# Patient Record
Sex: Female | Born: 1935 | Race: White | Hispanic: No | Marital: Married | Smoking: Never smoker
Health system: Southern US, Community
[De-identification: ages and names within clinical notes are randomized; demographics above are authoritative.]

## PROBLEM LIST (undated history)

## (undated) DIAGNOSIS — F329 Major depressive disorder, single episode, unspecified: Secondary | ICD-10-CM

## (undated) DIAGNOSIS — G20 Parkinson's disease: Secondary | ICD-10-CM

## (undated) DIAGNOSIS — G629 Polyneuropathy, unspecified: Secondary | ICD-10-CM

## (undated) DIAGNOSIS — F0280 Dementia in other diseases classified elsewhere without behavioral disturbance: Secondary | ICD-10-CM

## (undated) DIAGNOSIS — I10 Essential (primary) hypertension: Secondary | ICD-10-CM

## (undated) DIAGNOSIS — F32A Depression, unspecified: Secondary | ICD-10-CM

## (undated) DIAGNOSIS — E785 Hyperlipidemia, unspecified: Secondary | ICD-10-CM

## (undated) HISTORY — PX: OTHER SURGICAL HISTORY: SHX169

---

## 2018-04-06 ENCOUNTER — Telehealth: Payer: Self-pay | Admitting: Internal Medicine

## 2018-04-06 NOTE — Telephone Encounter (Signed)
Dr. Leone Payor Doc of the Day for 04/06/18 PM.   Patient lives at [facility] and is being referred from [employer] for diarrhea.  Referring Provider: Dr. Olene Craven [phone]  Records placed on Dr. Marvell Fuller desk for review.

## 2018-04-10 ENCOUNTER — Other Ambulatory Visit: Payer: Self-pay

## 2018-04-10 ENCOUNTER — Emergency Department (HOSPITAL_COMMUNITY): Payer: Medicare Other

## 2018-04-10 ENCOUNTER — Encounter (HOSPITAL_COMMUNITY): Payer: Self-pay | Admitting: Student

## 2018-04-10 ENCOUNTER — Inpatient Hospital Stay (HOSPITAL_COMMUNITY)
Admission: EM | Admit: 2018-04-10 | Discharge: 2018-04-24 | DRG: 193 | Disposition: A | Discharge status: Skilled Nursing Facility | Payer: Medicare Other | Source: Skilled Nursing Facility | Attending: Family Medicine | Admitting: Family Medicine

## 2018-04-10 ENCOUNTER — Inpatient Hospital Stay (HOSPITAL_COMMUNITY): Payer: Medicare Other

## 2018-04-10 DIAGNOSIS — Z1389 Encounter for screening for other disorder: Secondary | ICD-10-CM

## 2018-04-10 DIAGNOSIS — M4850XA Collapsed vertebra, not elsewhere classified, site unspecified, initial encounter for fracture: Secondary | ICD-10-CM | POA: Diagnosis not present

## 2018-04-10 DIAGNOSIS — G4700 Insomnia, unspecified: Secondary | ICD-10-CM | POA: Diagnosis present

## 2018-04-10 DIAGNOSIS — J181 Lobar pneumonia, unspecified organism: Secondary | ICD-10-CM | POA: Diagnosis present

## 2018-04-10 DIAGNOSIS — R339 Retention of urine, unspecified: Secondary | ICD-10-CM | POA: Diagnosis not present

## 2018-04-10 DIAGNOSIS — Z993 Dependence on wheelchair: Secondary | ICD-10-CM | POA: Diagnosis not present

## 2018-04-10 DIAGNOSIS — F329 Major depressive disorder, single episode, unspecified: Secondary | ICD-10-CM | POA: Diagnosis present

## 2018-04-10 DIAGNOSIS — J189 Pneumonia, unspecified organism: Secondary | ICD-10-CM

## 2018-04-10 DIAGNOSIS — R29898 Other symptoms and signs involving the musculoskeletal system: Secondary | ICD-10-CM | POA: Diagnosis not present

## 2018-04-10 DIAGNOSIS — R410 Disorientation, unspecified: Secondary | ICD-10-CM

## 2018-04-10 DIAGNOSIS — Z781 Physical restraint status: Secondary | ICD-10-CM | POA: Diagnosis not present

## 2018-04-10 DIAGNOSIS — Z79890 Hormone replacement therapy (postmenopausal): Secondary | ICD-10-CM

## 2018-04-10 DIAGNOSIS — R011 Cardiac murmur, unspecified: Secondary | ICD-10-CM | POA: Diagnosis present

## 2018-04-10 DIAGNOSIS — X58XXXA Exposure to other specified factors, initial encounter: Secondary | ICD-10-CM | POA: Diagnosis present

## 2018-04-10 DIAGNOSIS — Z79899 Other long term (current) drug therapy: Secondary | ICD-10-CM | POA: Diagnosis not present

## 2018-04-10 DIAGNOSIS — G3183 Dementia with Lewy bodies: Secondary | ICD-10-CM | POA: Diagnosis present

## 2018-04-10 DIAGNOSIS — Z9889 Other specified postprocedural states: Secondary | ICD-10-CM

## 2018-04-10 DIAGNOSIS — F0280 Dementia in other diseases classified elsewhere without behavioral disturbance: Secondary | ICD-10-CM | POA: Diagnosis present

## 2018-04-10 DIAGNOSIS — R404 Transient alteration of awareness: Secondary | ICD-10-CM | POA: Diagnosis not present

## 2018-04-10 DIAGNOSIS — R151 Fecal smearing: Secondary | ICD-10-CM

## 2018-04-10 DIAGNOSIS — R1310 Dysphagia, unspecified: Secondary | ICD-10-CM | POA: Diagnosis present

## 2018-04-10 DIAGNOSIS — R197 Diarrhea, unspecified: Secondary | ICD-10-CM | POA: Diagnosis present

## 2018-04-10 DIAGNOSIS — M810 Age-related osteoporosis without current pathological fracture: Secondary | ICD-10-CM | POA: Diagnosis present

## 2018-04-10 DIAGNOSIS — E785 Hyperlipidemia, unspecified: Secondary | ICD-10-CM | POA: Diagnosis present

## 2018-04-10 DIAGNOSIS — I10 Essential (primary) hypertension: Secondary | ICD-10-CM | POA: Diagnosis present

## 2018-04-10 DIAGNOSIS — R9431 Abnormal electrocardiogram [ECG] [EKG]: Secondary | ICD-10-CM | POA: Diagnosis present

## 2018-04-10 DIAGNOSIS — Z515 Encounter for palliative care: Secondary | ICD-10-CM

## 2018-04-10 DIAGNOSIS — G20 Parkinson's disease: Secondary | ICD-10-CM

## 2018-04-10 DIAGNOSIS — Z8673 Personal history of transient ischemic attack (TIA), and cerebral infarction without residual deficits: Secondary | ICD-10-CM

## 2018-04-10 DIAGNOSIS — E441 Mild protein-calorie malnutrition: Secondary | ICD-10-CM | POA: Diagnosis present

## 2018-04-10 DIAGNOSIS — M4856XA Collapsed vertebra, not elsewhere classified, lumbar region, initial encounter for fracture: Secondary | ICD-10-CM | POA: Diagnosis present

## 2018-04-10 DIAGNOSIS — R638 Other symptoms and signs concerning food and fluid intake: Secondary | ICD-10-CM | POA: Diagnosis not present

## 2018-04-10 DIAGNOSIS — K219 Gastro-esophageal reflux disease without esophagitis: Secondary | ICD-10-CM | POA: Diagnosis present

## 2018-04-10 DIAGNOSIS — Z6827 Body mass index (BMI) 27.0-27.9, adult: Secondary | ICD-10-CM

## 2018-04-10 DIAGNOSIS — Y95 Nosocomial condition: Secondary | ICD-10-CM | POA: Diagnosis present

## 2018-04-10 DIAGNOSIS — R4182 Altered mental status, unspecified: Secondary | ICD-10-CM | POA: Diagnosis present

## 2018-04-10 DIAGNOSIS — E876 Hypokalemia: Secondary | ICD-10-CM | POA: Diagnosis not present

## 2018-04-10 DIAGNOSIS — F05 Delirium due to known physiological condition: Secondary | ICD-10-CM | POA: Diagnosis present

## 2018-04-10 DIAGNOSIS — G629 Polyneuropathy, unspecified: Secondary | ICD-10-CM | POA: Diagnosis present

## 2018-04-10 DIAGNOSIS — M48061 Spinal stenosis, lumbar region without neurogenic claudication: Secondary | ICD-10-CM | POA: Diagnosis present

## 2018-04-10 DIAGNOSIS — Z7189 Other specified counseling: Secondary | ICD-10-CM | POA: Diagnosis not present

## 2018-04-10 DIAGNOSIS — G92 Toxic encephalopathy: Secondary | ICD-10-CM | POA: Diagnosis present

## 2018-04-10 DIAGNOSIS — Z885 Allergy status to narcotic agent status: Secondary | ICD-10-CM | POA: Diagnosis not present

## 2018-04-10 HISTORY — DX: Dementia in other diseases classified elsewhere, unspecified severity, without behavioral disturbance, psychotic disturbance, mood disturbance, and anxiety: F02.80

## 2018-04-10 HISTORY — DX: Parkinson's disease: G20

## 2018-04-10 HISTORY — DX: Hyperlipidemia, unspecified: E78.5

## 2018-04-10 HISTORY — DX: Polyneuropathy, unspecified: G62.9

## 2018-04-10 HISTORY — DX: Essential (primary) hypertension: I10

## 2018-04-10 HISTORY — DX: Depression, unspecified: F32.A

## 2018-04-10 HISTORY — DX: Major depressive disorder, single episode, unspecified: F32.9

## 2018-04-10 LAB — URINALYSIS, ROUTINE W REFLEX MICROSCOPIC
BILIRUBIN URINE: NEGATIVE
Bacteria, UA: NONE SEEN
Glucose, UA: NEGATIVE mg/dL
Ketones, ur: 5 mg/dL — AB
LEUKOCYTES UA: NEGATIVE
Nitrite: NEGATIVE
PH: 6 (ref 5.0–8.0)
Protein, ur: NEGATIVE mg/dL
SPECIFIC GRAVITY, URINE: 1.019 (ref 1.005–1.030)

## 2018-04-10 LAB — COMPREHENSIVE METABOLIC PANEL
ALBUMIN: 3.9 g/dL (ref 3.5–5.0)
ANION GAP: 12 (ref 5–15)
AST: 24 U/L (ref 15–41)
Alkaline Phosphatase: 71 U/L (ref 38–126)
BILIRUBIN TOTAL: 0.7 mg/dL (ref 0.3–1.2)
BUN: 15 mg/dL (ref 8–23)
CO2: 24 mmol/L (ref 22–32)
Calcium: 9 mg/dL (ref 8.9–10.3)
Chloride: 101 mmol/L (ref 98–111)
Creatinine, Ser: 0.84 mg/dL (ref 0.44–1.00)
GFR calc non Af Amer: >60 mL/min (ref >60)
Glucose, Bld: 101 mg/dL — ABNORMAL HIGH (ref 70–99)
POTASSIUM: 4.3 mmol/L (ref 3.5–5.1)
Sodium: 137 mmol/L (ref 135–145)
TOTAL PROTEIN: 7.1 g/dL (ref 6.5–8.1)

## 2018-04-10 LAB — CBC
HEMATOCRIT: 47.7 % — AB (ref 36.0–46.0)
Hemoglobin: 15.4 g/dL — ABNORMAL HIGH (ref 12.0–15.0)
MCH: 31.6 pg (ref 26.0–34.0)
MCHC: 32.3 g/dL (ref 30.0–36.0)
MCV: 97.7 fL (ref 78.0–100.0)
Platelets: 241 10*3/uL (ref 150–400)
RBC: 4.88 MIL/uL (ref 3.87–5.11)
RDW: 14.3 % (ref 11.5–15.5)
WBC: 7.7 10*3/uL (ref 4.0–10.5)

## 2018-04-10 LAB — I-STAT CG4 LACTIC ACID, ED
LACTIC ACID, VENOUS: 0.93 mmol/L (ref 0.5–1.9)
Lactic Acid, Venous: 1.24 mmol/L (ref 0.5–1.9)

## 2018-04-10 LAB — CBG MONITORING, ED: GLUCOSE-CAPILLARY: 102 mg/dL — AB (ref 70–99)

## 2018-04-10 MED ORDER — MELATONIN 3 MG PO TABS
3.0000 mg | ORAL_TABLET | Freq: Every evening | ORAL | Status: DC | PRN
  Filled 2018-04-10: qty 1

## 2018-04-10 MED ORDER — DEXTROSE-NACL 5-0.9 % IV SOLN
INTRAVENOUS | Status: DC
  Administered 2018-04-10 – 2018-04-13 (×5): via INTRAVENOUS

## 2018-04-10 MED ORDER — SODIUM CHLORIDE 0.9 % IV BOLUS
1000.0000 mL | Freq: Once | INTRAVENOUS | Status: AC
  Administered 2018-04-10: 1000 mL via INTRAVENOUS

## 2018-04-10 MED ORDER — ACETAMINOPHEN 325 MG PO TABS
650.0000 mg | ORAL_TABLET | Freq: Four times a day (QID) | ORAL | Status: DC | PRN
  Filled 2018-04-10: qty 2

## 2018-04-10 MED ORDER — ACETAMINOPHEN 650 MG RE SUPP: 650.0000 mg | Freq: Four times a day (QID) | RECTAL | Status: DC | PRN

## 2018-04-10 MED ORDER — SODIUM CHLORIDE 0.9 % IV SOLN
500.0000 mg | Freq: Once | INTRAVENOUS | Status: AC
  Administered 2018-04-10: 500 mg via INTRAVENOUS
  Filled 2018-04-10: qty 500

## 2018-04-10 MED ORDER — CARBIDOPA-LEVODOPA 25-100 MG PO TABS
0.5000 | ORAL_TABLET | Freq: Two times a day (BID) | ORAL | Status: DC
  Filled 2018-04-10 (×4): qty 0.5

## 2018-04-10 MED ORDER — ENOXAPARIN SODIUM 40 MG/0.4ML ~~LOC~~ SOLN
40.0000 mg | SUBCUTANEOUS | Status: DC
  Administered 2018-04-10 – 2018-04-24 (×12): 40 mg via SUBCUTANEOUS
  Filled 2018-04-10 (×14): qty 0.4

## 2018-04-10 MED ORDER — SODIUM CHLORIDE 0.9 % IV SOLN
1.0000 g | Freq: Once | INTRAVENOUS | Status: AC
  Administered 2018-04-10: 1 g via INTRAVENOUS
  Filled 2018-04-10: qty 10

## 2018-04-10 MED ORDER — GABAPENTIN 100 MG PO CAPS: 100.0000 mg | ORAL_CAPSULE | Freq: Three times a day (TID) | ORAL | Status: DC

## 2018-04-10 NOTE — ED Provider Notes (Signed)
[facility] EMERGENCY DEPARTMENT Provider Note   CSN: [id number] Arrival date & time: 04/10/18  1111     History   Chief Complaint Chief Complaint  Patient presents with  . Altered Mental Status    HPI Debra Pierce is a 82 y.o. female.  The history is provided by the patient and medical records. No language interpreter was used.  Altered Mental Status   This is a new problem. The current episode started more than 2 days ago. The problem has been gradually worsening. Associated symptoms include confusion and hallucinations. Pertinent negatives include no unresponsiveness and no agitation. Her past medical history is significant for dementia. Her past medical history does not include CVA.    No past medical history on file.  There are no active problems to display for this patient.   Past Surgical History:  Procedure Laterality Date  . L5 kyphoplasty Bilateral      OB History   None      Home Medications    Prior to Admission medications   Not on File    Family History No family history on file.  Social History Social History   Tobacco Use  . Smoking status: Not on file  Substance Use Topics  . Alcohol use: Not on file  . Drug use: Not on file     Allergies   Patient has no allergy information on record.   Review of Systems Review of Systems  Constitutional: Positive for chills, fatigue and fever. Negative for diaphoresis.  HENT: Negative for congestion.   Eyes: Negative for visual disturbance.  Respiratory: Positive for cough. Negative for chest tightness, shortness of breath and wheezing.   Cardiovascular: Negative for chest pain and palpitations.  Gastrointestinal: Negative for abdominal pain, constipation, diarrhea, nausea and rectal pain.  Genitourinary: Positive for frequency. Negative for dysuria.  Musculoskeletal: Negative for back pain, neck pain and neck stiffness.  Skin: Negative for rash and wound.  Neurological:  Negative for dizziness and headaches.  Psychiatric/Behavioral: Positive for confusion and hallucinations. Negative for agitation.  All other systems reviewed and are negative.    Physical Exam Updated Vital Signs BP (!) 142/89   Pulse 87   Temp 98.2 F (36.8 C) (Axillary)   Resp 20   Ht 5\' 1"  (1.549 m)   Wt 65.8 kg   SpO2 95%   BMI 27.40 kg/m   Physical Exam  Constitutional: She appears well-developed and well-nourished. No distress.  HENT:  Head: Normocephalic.  Nose: Nose normal.  Mouth/Throat: Oropharynx is clear and moist. No oropharyngeal exudate.  Eyes: Pupils are equal, round, and reactive to light. Conjunctivae and EOM are normal.  Neck: Normal range of motion.  Cardiovascular: Normal rate and intact distal pulses.  Murmur heard. Pulmonary/Chest: Effort normal. No stridor. No respiratory distress. She has no wheezes. She has rhonchi. She exhibits no tenderness.  Abdominal: Soft. She exhibits no distension. There is no tenderness.  Musculoskeletal: She exhibits no tenderness.  Neurological: She is alert. She displays normal reflexes. No sensory deficit.  Skin: Capillary refill takes less than 2 seconds. No rash noted. She is not diaphoretic. No erythema.  Psychiatric: She has a normal mood and affect.  Nursing note and vitals reviewed.    ED Treatments / Results  Labs (all labs ordered are listed, but only abnormal results are displayed) Labs Reviewed  COMPREHENSIVE METABOLIC PANEL - Abnormal; Notable for the following components:      Result Value   Glucose, Bld 101 (*)  All other components within normal limits  CBC - Abnormal; Notable for the following components:   Hemoglobin 15.4 (*)    HCT 47.7 (*)    All other components within normal limits  URINALYSIS, ROUTINE W REFLEX MICROSCOPIC - Abnormal; Notable for the following components:   APPearance HAZY (*)    Hgb urine dipstick SMALL (*)    Ketones, ur 5 (*)    All other components within normal  limits  CBG MONITORING, ED - Abnormal; Notable for the following components:   Glucose-Capillary 102 (*)    All other components within normal limits  URINE CULTURE  CULTURE, BLOOD (ROUTINE X 2)  CULTURE, BLOOD (ROUTINE X 2)  TSH  BASIC METABOLIC PANEL  CBC  I-STAT CG4 LACTIC ACID, ED  I-STAT CG4 LACTIC ACID, ED    EKG EKG Interpretation  Date/Time:  Monday April 10 2018 11:15:14 EDT Ventricular Rate:  90 PR Interval:    QRS Duration: 128 QT Interval:  401 QTC Calculation: 491 R Axis:   -29 Text Interpretation:  Sinus rhythm Left bundle branch block No prior ECG for comparison.  No STEMI Confirmed by Theda Belfast ([id number]) on 04/10/2018 12:41:33 PM   Radiology Dg Chest 2 View  Result Date: 04/10/2018 CLINICAL DATA:  Mental status change EXAM: CHEST - 2 VIEW COMPARISON:  None. FINDINGS: The lungs are adequately inflated. The lung markings are coarse in the anterior inferior aspect of the right lung likely in the middle lobe. There is no pleural effusion. The heart and pulmonary vascularity are normal. The mediastinum is normal in width. The trachea is midline. The bony thorax exhibits no acute abnormality. There is faint calcification in the wall of the aortic arch. IMPRESSION: Probable atelectasis or pneumonia in the right middle lobe anteromedially. The findings are accentuated by mild hypoinflation. Followup PA and lateral chest X-ray is recommended in 3-4 weeks following trial of antibiotic therapy to ensure resolution and exclude underlying malignancy. Electronically Signed   By: David  [place] M.D.   On: 04/10/2018 14:10   Dg Abd 1 View  Result Date: 04/10/2018 CLINICAL DATA:  Screening for foreign body EXAM: ABDOMEN - 1 VIEW COMPARISON:  None. FINDINGS: Lung bases are clear. Gas pattern is nonobstructed. Presumed button snap or monitoring lead at the right cardio phrenic sulcus. Kyphoplasty material at L5. IMPRESSION: Nonobstructed gas pattern. Radiopaque snap or support lead  at the right cardio phrenic angle, correlate with physical exam. Otherwise negative for metallic density radiopaque foreign body. Electronically Signed   By: Jasmine Pang M.D.   On: 04/10/2018 22:48   Ct Head Wo Contrast  Result Date: 04/10/2018 CLINICAL DATA:  Confusion with garbled speech and recent hallucinations EXAM: CT HEAD WITHOUT CONTRAST TECHNIQUE: Contiguous axial images were obtained from the base of the skull through the vertex without intravenous contrast. COMPARISON:  None. FINDINGS: Note that there is a degree of motion artifact. Brain: The ventricles and sulci overall are within normal limits for age. There is asymmetric atrophy along the left anterior temporal lobe compared to the right. There is no evident intracranial mass, hemorrhage, extra-axial fluid collection, or midline shift. There is decreased attenuation at the anterior left superior temporal-frontal junction which is concerning for a potentially recent small infarct. This finding is best appreciated on the coronal images. There is extensive small vessel disease throughout the centra semiovale bilaterally. Decreased attenuation is noted in the anterior limb of the left external capsule as well as in both internal capsules. Small vessel disease is also  noted in each lateral thalamus. Vascular: There is no evident hyperdense vessel. There is calcification in each carotid siphon. Skull: Bony calvarium appears intact. Sinuses/Orbits: There is opacification throughout the left frontal sinus. There is mucosal thickening in multiple ethmoid air cells. There is mucosal thickening throughout much of the left maxillary antrum. Orbits appear symmetric bilaterally. Other: Mastoid air cells are clear. IMPRESSION: 1. Age uncertain focal infarct superior left temporal-left frontal junction on the left, best appreciated on the coronal images, most notably coronal slice 38 series 6. 2.  Extensive supratentorial small vessel disease. 3.  No evident  mass or hemorrhage. 4.  There are foci of vascular calcification. 5.  Multifocal paranasal sinus disease. Electronically Signed   By: Bretta Bang III M.D.   On: 04/10/2018 14:28   Mr [name]  Result Date: 04/10/2018 CLINICAL DATA:  82 y/o  F; L5 kyphoplasty, question infection. EXAM: MRI LUMBAR SPINE WITHOUT CONTRAST TECHNIQUE: Sagittal T2, stir, T1 sequences were acquired. The patient was unable to continue additional sequences were not acquired. COMPARISON:  None. FINDINGS: Severe motion artifact. L4 moderate compression deformity at L5 low signal from kyphoplasty material noted. Motion artifact limits evaluation including assessment for infection. IMPRESSION: Severe motion artifact. Limited exam. Repeat imaging is recommended when patient is able to remain still. Electronically Signed   By: Mitzi Hansen M.D.   On: 04/10/2018 23:24    Procedures Procedures (including critical care time)  CRITICAL CARE Performed by: Canary Brim Tegeler Total critical care time: 35 minutes Critical care time was exclusive of separately billable procedures and treating other patients. Critical care was necessary to treat or prevent imminent or life-threatening deterioration. Critical care was time spent personally by me on the following activities: development of treatment plan with patient and/or surrogate as well as nursing, discussions with consultants, evaluation of patient's response to treatment, examination of patient, obtaining history from patient or surrogate, ordering and performing treatments and interventions, ordering and review of laboratory studies, ordering and review of radiographic studies, pulse oximetry and re-evaluation of patient's condition.   Medications Ordered in ED Medications  carbidopa-levodopa (SINEMET IR) 25-100 MG per tablet immediate release 0.5 tablet (0.5 tablets Oral Not Given 04/10/18 2124)  Melatonin TABS 3 mg (has no administration in time  range)  enoxaparin (LOVENOX) injection 40 mg (40 mg Subcutaneous Given 04/10/18 1938)  acetaminophen (TYLENOL) tablet 650 mg (has no administration in time range)    Or  acetaminophen (TYLENOL) suppository 650 mg (has no administration in time range)  dextrose 5 %-0.9 % sodium chloride infusion ( Intravenous Restarted 04/10/18 2314)  sodium chloride 0.9 % bolus 1,000 mL (0 mLs Intravenous Stopped 04/10/18 1346)  cefTRIAXone (ROCEPHIN) 1 g in sodium chloride 0.9 % 100 mL IVPB (1 g Intravenous Transfusing/Transfer 04/10/18 1541)  azithromycin (ZITHROMAX) 500 mg in sodium chloride 0.9 % 250 mL IVPB (0 mg Intravenous Stopped 04/10/18 1610)     Initial Impression / Assessment and Plan / ED Course  I have reviewed the triage vital signs and the nursing notes.  Pertinent labs & imaging results that were available during my care of the patient were reviewed by me and considered in my medical decision making (see chart for details).     Debra Pierce is a 82 y.o. female with a past medical history significant for Parkinson's and dementia who presents from [facility] facility for altered mental status and concern for delirium.  According to family was accompanied patient, patient recently moved from the [place]  to [place] in the last month.  They report that over the last week she has had worsening mental status that precipitously declined over the last 36 hours.  They report that she has begun hallucinating and seeing children that are not in her room reaching out towards them.  She had an episode like this before in the setting of recent surgery.  They report that she has had some increase in her urinary frequency although was checked last week for UTI and was reportedly negative.  Patient reports she has been coughing at night and was found to have a fever of 100.3.  Patient denies any headaches.  Family says that patient is normally conversational but has been stalking less and acting more confused.   Family is also concerned there may be problems with her medications.  They report she is had no focal neurologic deficits or unilateral changes.  On exam, patient is able to answer some questions or peripherally.  Patient has symmetric strength in arms and legs.  No facial droop seen.  Patient has no evidence of PTA or RPA or on oropharyngeal exam.  Patient's lungs have coarseness bilaterally.  Faint murmur appreciated.  Abdomen nontender.  Patient has mild edema in the legs with bilateral stockings in place.  No other focal neurologic deficit seen.  Clinically I am concerned patient may have pneumonia given the recent fever yesterday, cough, and coarse breath sounds.  Suspect this may be the cause of her worsening delirium.  Given her lack of headache or nuchal rigidity on exam, doubt meningitis or acute encephalitis.  Patient may also have mental status changes in relation to her Parkinson's medications.  Will recheck labs including urinalysis and a chest x-ray.    Given the patient's worsening mental status per family and new hallucinations, anticipate patient will be admitted.    Patient also have head CT given a reported fall 1 month ago with no head imaging at that time.  2:14 PM On chest x-ray, pneumonia was discovered.  Given the patient's clinical findings and the acute delirium, patient will be treated with antibiotic and admitted for pneumonia.    Final Clinical Impressions(s) / ED Diagnoses   Final diagnoses:  Delirium  Community acquired pneumonia, unspecified laterality    ED Discharge Orders    None      Clinical Impression: 1. Delirium   2. Community acquired pneumonia, unspecified laterality     Disposition: Admit  This note was prepared with assistance of Conservation officer, historic buildings. Occasional wrong-word or sound-a-like substitutions may have occurred due to the inherent limitations of voice recognition software.     Tegeler, Canary Brim, MD 04/10/18  [phone]

## 2018-04-10 NOTE — Discharge Summary (Addendum)
FMTS Attending Daily Note: Debra Starr, MD  Pager [phone]  Office [phone] I have seen and examined this patient, reviewed their chart. I have discussed this patient with the resident. I agree with the resident's findings, assessment and care plan. 1. Recommend encouraging PO intake during periods of wakefulness- timing of Parkinson's disease medications BEFORE meals 2. Bladder scans twice daily for retention 3. Dietary modifications as below    Family Medicine Teaching [facility] Discharge Summary  Patient name: Debra Pierce Medical record number: [id number] Date of birth: [birth year] Age: 82 y.o. 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  Able to ask for her own needs including water.  Does not know date or location.  Per Dr. Selena Batten on Day of Discharge  Brief Hospital Course:  [name] presented with delirium in the setting of progressive Parkinson's Disease with suspected Lewy Body dementia. At the time of presentation, she was unable ambulate, speak and was extremely somnolent. On CXR, she was found to have RML infiltrates consistent with HCAP for which she was given IV Ceftriaxone and Azithromycin. She completed a course of Zosyn for 7 days due to concern aspiration pnemonia for new dysphagia  and continued confusion after head CT revealed unknown age vessel disease/ infarct.   Neurology was consulted and recommended that her Sinemet be titrated up from 0.5 tablet per day to 1 tablet 4 times per day, and then she should be followed up as outpatient. She then had entacapone added to her daily sinemet to help with rigidity. These can be crushed in apple sauce and given togehter.   She was also found to have dysphagia for which she was made NPO and subsequently had a NG feeding tube placed for 4 days in order to meet nutrition status. She is tolerating dysphagia 1 diet.   Due to her persistent decline, palliative was consulted for a family meeting to discuss goals of care with [name] son Arlys John. Patient to remain full code and continue to have palliative follow her as she transitioned to a SNF.   A repeat lumbar spine MRI showed concern for new compression fracture at the level of L5, neurosurgery was consulted and recommended LSO brace and no further follow up. She has not walked since her kyphoplasty this summer. She can be transferred to the chair with assistance.  PT Recommendations  Bed Mobility Overal bed mobility: Needs Assistance Bed Mobility: Sit to Supine;Rolling;Sidelying to Sit Rolling: Max assist;+2 for physical assistance Sidelying to sit: Max assist;+2 for physical assistance Supine to sit: Max assist;+2 for physical assistance  Transfers Overall transfer level: Needs assistance Equipment used: 2 person hand held assist Transfers: Sit to/from Stand Sit to Stand: +2 physical assistance;Max assist Stand pivot transfers: Max assist;+2 physical assistance General transfer comment: could not sucessfully come to standing, x3 trials of standing with max A x2 and bed pad for support, blocking knees. pt with half squat at  best, family happy she was trying and partcipiating.    Urinary symptoms: Incontinent of urine. Does have intermittent retention. Recommend BID  bladder scans.   Issues for Follow Up:  1. Patient to continue on sinemet QID with entacapone QID- crushed in applesauce to help with Parkinson's rigidity.  2. Patient should follow up with neurology for her Parkinson's. 3. Patient with some trouble swallowing, will need dysphagia 1 diet 4. Repeat CXR recommended on 10/14 to ensure resolution of RML pna 5. Stopped donepezil and gabapentin during hospitalization.  6. Patient to follow with palliative outpatient  Significant Procedures: none  Significant Labs and Imaging:  No results for input(s): WBC, HGB, HCT, PLT in the last 168 hours. Recent Labs  Lab 04/23/18 0322  NA 145  K 3.9  CL 110  CO2 22  GLUCOSE 106*  BUN 25*  CREATININE 0.78  CALCIUM 9.2   Dg Chest 2 View Result Date: 04/10/2018 CLINICAL DATA:  Mental status change EXAM: CHEST - 2 VIEW COMPARISON:  None. FINDINGS: The lungs are adequately inflated. The lung markings are coarse in the anterior inferior aspect of the right lung likely in the middle lobe. There is no pleural effusion. The heart and pulmonary vascularity are normal. The mediastinum is normal in width. The trachea is midline. The bony thorax exhibits no acute abnormality. There is faint calcification in the wall of the aortic arch. IMPRESSION: Probable atelectasis or pneumonia in the right middle lobe anteromedially. The findings are accentuated by mild hypoinflation. Followup PA and lateral chest X-ray is recommended in 3-4 weeks following trial of antibiotic therapy to ensure resolution and exclude underlying malignancy. Electronically Signed   By: David  [place] M.D.   On: 04/10/2018 14:10   Dg Abd 1 View Result Date: 04/10/2018 CLINICAL DATA:  Screening for foreign body EXAM: ABDOMEN - 1 VIEW COMPARISON:  None. FINDINGS: Lung bases are clear. Gas pattern is nonobstructed. Presumed button snap or monitoring lead at the right cardio phrenic sulcus. Kyphoplasty material at L5. IMPRESSION: Nonobstructed gas pattern.  Radiopaque snap or support lead at the right cardio phrenic angle, correlate with physical exam. Otherwise negative for metallic density radiopaque foreign body. Electronically Signed   By: Jasmine Pang M.D.   On: 04/10/2018 22:48   Ct Head Wo Contrast Result Date: 04/10/2018 CLINICAL DATA:  Confusion with garbled speech and recent hallucinations EXAM: CT HEAD WITHOUT CONTRAST TECHNIQUE: Contiguous axial images were obtained from the base of the skull through the vertex without intravenous contrast. COMPARISON:  None. FINDINGS: Note that there is a degree of motion artifact. Brain: The ventricles and sulci overall are within normal limits for age. There is asymmetric atrophy along the left anterior temporal lobe compared to the right. There is no evident intracranial mass, hemorrhage, extra-axial fluid collection, or midline shift. There is decreased attenuation at the anterior left superior temporal-frontal junction which is concerning for a potentially recent small infarct. This finding is best appreciated on the coronal images. There is extensive small vessel disease throughout the centra semiovale bilaterally. Decreased attenuation is noted in the anterior limb of the left external capsule as well as in both internal capsules. Small vessel disease is also noted in each lateral thalamus. Vascular: There is no evident hyperdense vessel. There is calcification in each carotid siphon. Skull: Bony calvarium appears intact. Sinuses/Orbits: There is opacification throughout the left frontal sinus. There is mucosal thickening in multiple ethmoid air cells. There is mucosal thickening throughout much of the left maxillary antrum. Orbits appear  symmetric bilaterally. Other: Mastoid air cells are clear. IMPRESSION: 1. Age uncertain focal infarct superior left temporal-left frontal junction on the left, best appreciated on the coronal images, most notably coronal slice 38 series 6. 2.  Extensive supratentorial small  vessel disease. 3.  No evident mass or hemorrhage. 4.  There are foci of vascular calcification. 5.  Multifocal paranasal sinus disease. Electronically Signed   By: Bretta Bang III M.D.   On: 04/10/2018 14:28   Mr [name] Result Date: 04/10/2018 CLINICAL DATA:  82 y/o  F; L5 kyphoplasty, question infection. EXAM: MRI LUMBAR SPINE WITHOUT CONTRAST TECHNIQUE: Sagittal T2, stir, T1 sequences were acquired. The patient was unable to continue additional sequences were not acquired. COMPARISON:  None. FINDINGS: Severe motion artifact. L4 moderate compression deformity at L5 low signal from kyphoplasty material noted. Motion artifact limits evaluation including assessment for infection. IMPRESSION: Severe motion artifact. Limited exam. Repeat imaging is recommended when patient is able to remain still. Electronically Signed   By: Mitzi Hansen M.D.   On: 04/10/2018 23:24   Results/Tests Pending at Time of Discharge: none  Discharge Medications:  Allergies as of 04/24/2018      Reactions   Codeine Other (See Comments)   Family stated unknown reaction   Hydrocodone       Medication List    STOP taking these medications   BIOFREEZE 4 % Gel Generic drug:  Menthol (Topical Analgesic)   [facility] MELATONIN 3 MG Tabs Generic drug:  Melatonin   [employer] PAIN RELIEF 500 MG tablet Generic drug:  acetaminophen   donepezil 10 MG tablet Commonly known as:  ARICEPT   famotidine 20 MG tablet Commonly known as:  PEPCID   gabapentin 100 MG capsule Commonly known as:  NEURONTIN   loperamide 2 MG capsule Commonly known as:  IMODIUM   simvastatin 40 MG tablet Commonly known as:  ZOCOR     TAKE these medications   carbidopa-levodopa 25-100 MG tablet Commonly known as:  SINEMET IR Take 1 tablet by mouth 4 (four) times daily. Take with Comtan (entacapone). May crush and take with apple sauce. What changed:    how much to take  when to take this  additional instructions    entacapone 200 MG tablet Commonly known as:  COMTAN Take 1 tablet (200 mg total) by mouth 4 (four) times daily. Take with sinemet (carbidopa-levodopa). May crush and take with apple sauce.   feeding supplement (ENSURE ENLIVE) Liqd Take 237 mLs by mouth 3 (three) times daily between meals.   food thickener Powd Commonly known as:  THICK IT As needed   multivitamin with minerals tablet Take 1 tablet by mouth daily.   sertraline 25 MG tablet Commonly known as:  ZOLOFT Take 25 mg by mouth at bedtime.            Durable Medical Equipment  (From admission, onward)         Start     Ordered   04/12/18 1139  For home use only DME 3 n 1  Once     04/12/18 1138   04/12/18 1139  For home use only DME Walker rolling  Once    Question:  Patient needs a walker to treat with the following condition  Answer:  Balance problem   04/12/18 1138          Discharge Instructions: Please refer to Patient Instructions section of EMR for full details.  Patient was counseled important signs and symptoms that  should prompt return to medical care, changes in medications, dietary instructions, activity restrictions, and follow up appointments.   Please have chest xray completed on 05/15/18 to confirm resolution of pneumonia you were treated for during this admission.   Follow-Up Appointments: Follow-up Information    [facility]. Go on 05/11/2018.   Why:  Appointment at 10:00am, arrive early for check in Contact information: [address]     [address] [place] [address] [phone]       [facility] .   Specialty:  Geriatric Medicine Contact information: [address] [place] [address] [phone]          [place], DO PGY-2, [facility]  11:53 AM 04/24/18

## 2018-04-10 NOTE — H&P (Addendum)
Family Medicine Teaching [facility] Admission History and Physical Service Pager: [phone]  Patient name: Debra Pierce Medical record number: [id number] Date of birth: 07-30-1936 Age: 82 y.o. Gender: female  Primary Care Provider: Angela Cox, MD Consultants: none Code Status: Full  Chief Complaint: altered mental status   Assessment and Plan: Debra Pierce is a 81 y.o. female presenting with altered mental status. PMH is significant for Parkinson's Disease with dementia, HTN, HLD, Depression, neuropathy, and GERD.  Altered Mental Status: Patient has been reportedly hallucinating, more somnolent and having difficulty communicating for about 1 week. Previously able to converse fluently one week ago. She slowly follows commands but is no longer able to make phone calls or call for assistance with her ADLs. Previously able to walk without assistance, and after kyphoplasty ~ 1 month ago, patient wheelchair bound. Differentials include: acute delirium superimposed on dementia, likely from infection due to possible RML pna as seen on CXR or from recent kyphoplasty, no current signs of UTI on UA; less likely metabolic given normal electrolytes and glucose level; less likely TIA or CVA given no focal findings on exam or reported from assisted living PT, however Head CT shows uncertain age focal infarct in superior left temporal-left frontal junction, small vessel disease with no mass or hemorrhage.; patient with no recent medication changes and not on many sedating medications per son; no reported traumatic falls; could also be due to uncontrolled pain given recent surgery as son reports that she never asks for pain medications -admit to telemetry, Dr. Jennette Kettle -spine MRI with and without due to concern for postop inability to ambulate  -vitals q4 hours  -keep lights on during day and minimal stimulation at night -blood cultures x2 collected  -Urine culture collected, UA wnl -NPO pending swallow  eval, aspiration precautions until mentation better -started on CTX and azithromycin in ED for CAP coverage - PT/Ot to eval and treat  Right middle lobe CAP: Patient reports night time cough, had fever of 100.3 at her assisted living facility.  CXR showing probable atelectasis or pneumonia in the right middle lobe anteromedially.  -started Ceftriaxone 1g and 500mg  Azithromycin  -monitor respiratory status and oxygen saturation -repeat CXR in 3-4 weeks in order to prove resolution or rule out malignancy   Parkinson's Disease with Dementia: Patient's son notes that her Parkinson's has been worsening as of late and that her medications have not been adjusted in 5 years. She is scheudled to see her neurolgist for this matter later this week. Patient prescribed Carbidopa/Levodopa 25-100 mg BID, Aricept 10 mg. Son reports that her baseline prior to surgery was that she was walking unassisted. After surgery she has been wheelchair bound in rehab. She has been working with PT daily and enjoys this. She is normally alert and interactive, able to make jokes. She is able to call him on the phone and feed herself.  -continue Sinemet IR 25-100 mg BID  -holding Aricept 10 mg in setting of AMS - PT/OT to eval and treat   HTN: Patient reportedly on medications for high blood pressure. Son reports that these medications have been tapered. Unsure of what medications she was taking. BP 166/86 & 150/89 currently. Patient does not complain of HA or blurry vision.  -will continue to monitor   HLD: reported history of medication at home for cholesterol.  -holding home Zocor 40mg  until mental status improves   ?GERD: Patient on Pepcid at home, possibly prn.  -hold home Pepcid 20mg  until mental status improves  Insomnia: Patient on 3 mg Melatonin at home. -continued home Melatonin 3mg  as needed for insomnia per son's request  Depression: patient on Zoloft 25 mg qHS. -Will hold until patient mental status  improves.   Neuropathy: patient on home Gabapentin 100mg  TID.  -holding Gabapentin 100mg  given hallucinations and AMS   Osteoporosis: patient with recent kyphoplasty and compression fracture - Does not appear to be on therapy currently - May require bisphosphonate and calcium/vitamin D  Prolonged QT: EKG with Qtc of 492. - Not currently on any prolonging medications, will continue to avoid and monitor  FEN/GI: regular diet once mental status improves  Prophylaxis: Lovenox  Disposition: admit to telemetry  History of Present Illness:  Debra Pierce is a 82 y.o. female presenting with AMS. She has a h/o Parkinson's and dementia. She is on treatment for these. She has ben here for one month. For the past week, she has started having a decline. Last night, she got much worse. This am she was much worse. She told the ED doctor that she has been coughing. She had a fever last night to 100.3, and her heart rate was in the 90's. She has been very anxious in the ED because she was ready to go. She was in the assisted living due to a fall and broken vertebrae. She had a kyphoplasty where they placed cement in her vertebrae.   [place] Family moved her here to take better care of her.  Yesterday she would eat with assistance, but today she has not eaten at all. When she first came out of surgery in June, she needed help with eating, but not since then. At her baseline of functionality, she is wheelchair bound following her surgery, currently  rehabilitating, able to feed herself and converse appropriately. She can use her own cell phone. She currently resides in [facility].   Her Parkinson's medication has not been updated in 5 years. She has a neurologist appointment on Thursday 04/13/18.   Son reports no complaints of SOB, some cough at night. Reports some diarrhea. 4-5 UTI's in the past year.   Review Of Systems: Per HPI with the following additions:  Patient altered an unable to answer questions.   Review of Systems  Constitutional: Positive for fever.  Respiratory: Positive for cough, shortness of breath and wheezing.   Cardiovascular: Negative for chest pain and orthopnea.  Gastrointestinal: Positive for nausea. Negative for constipation, diarrhea and vomiting.  Neurological: Positive for tremors. Negative for headaches.    Patient Active Problem List   Diagnosis Date Noted  . Altered mental status 04/10/2018    Past Medical History: Past Medical History:  Diagnosis Date  . Benign essential HTN   . Depression   . HLD (hyperlipidemia)   . Neuropathy   . Parkinson's disease dementia (HCC)     HTN, HLD, dementia, Parkinson's, Depression, neuropathy after surgery  Past Surgical History: Past Surgical History:  Procedure Laterality Date  . L5 kyphoplasty Bilateral      Social History: Social History   Tobacco Use  . Smoking status: Never Smoker  Substance Use Topics  . Alcohol use: Not Currently  . Drug use: Never   Additional social history: Never smoker, occasional glass of wine, no other drug use Please also refer to relevant sections of EMR.  Family History: History reviewed. No pertinent family history. Grandmother and Aunt both had dementia  Allergies and Medications: Allergies  Allergen Reactions  .  Codeine Other (See Comments)    Family stated unknown reaction  . Hydrocodone    No current facility-administered medications on file prior to encounter.    Current Outpatient Medications on File Prior to Encounter  Medication Sig Dispense Refill  . carbidopa-levodopa (SINEMET IR) 25-100 MG tablet Take 0.5 tablets by mouth 2 (two) times daily.    . [employer] MELATONIN 3 MG TABS Take 3 mg by mouth at bedtime.   0  . [employer] PAIN RELIEF 500 MG tablet Take 1,000 mg by mouth 3 (three) times daily as needed for pain.  0  . donepezil (ARICEPT) 10 MG tablet Take 10 mg by mouth at bedtime.  0  . famotidine (PEPCID) 20  MG tablet Take 20 mg by mouth daily.    Marland Kitchen gabapentin (NEURONTIN) 100 MG capsule Take 100 mg by mouth 3 (three) times daily.  0  . loperamide (IMODIUM) 2 MG capsule Take 2 mg by mouth every 6 (six) hours as needed for diarrhea or loose stools.    . Menthol, Topical Analgesic, (BIOFREEZE) 4 % GEL Apply 1 application topically daily.    . Multiple Vitamins-Minerals (MULTIVITAMIN WITH MINERALS) tablet Take 1 tablet by mouth daily.    . sertraline (ZOLOFT) 25 MG tablet Take 25 mg by mouth at bedtime.    . simvastatin (ZOCOR) 40 MG tablet Take 40 mg by mouth at bedtime.      Objective: BP (!) 150/89 (BP Location: Right Arm)   Pulse 89   Temp 97.9 F (36.6 C) (Oral)   Resp 18   Ht 5\' 1"  (1.549 m)   Wt 65.8 kg   SpO2 98%   BMI 27.40 kg/m   Exam:  General: elderly female appearing younger than stated age sleeping in NAD with open mouth  ENTM: mouth and lips with dried skin and membranes  Cardiovascular: distant heart sounds, normal s1 and s2 no rubs, gallops or obvious murmurs  Respiratory: decreased respiratory effort 2/2 alter mental status   Gastrointestinal: ND, NT MSK: patient moves all extremities against gravity  Derm: no rashes  Neuro: patient is oriented to self, not alert, requires frequent arousal during exam, patient able to move all extremities and follows commands, slowly responds to questions but has mostly appropriate responses, unable to locate any focal neurological deficits at the time, no facial droop   Labs and Imaging: CBC BMET  Recent Labs  Lab 04/10/18 1126  WBC 7.7  HGB 15.4*  HCT 47.7*  PLT 241   Recent Labs  Lab 04/10/18 1126  NA 137  K 4.3  CL 101  CO2 24  BUN 15  CREATININE 0.84  GLUCOSE 101*  CALCIUM 9.0     Dg Chest 2 View  Result Date: 04/10/2018 CLINICAL DATA:  Mental status change EXAM: CHEST - 2 VIEW COMPARISON:  None. FINDINGS: The lungs are adequately inflated. The lung markings are coarse in the anterior inferior aspect of the right  lung likely in the middle lobe. There is no pleural effusion. The heart and pulmonary vascularity are normal. The mediastinum is normal in width. The trachea is midline. The bony thorax exhibits no acute abnormality. There is faint calcification in the wall of the aortic arch. IMPRESSION: Probable atelectasis or pneumonia in the right middle lobe anteromedially. The findings are accentuated by mild hypoinflation. Followup PA and lateral chest X-ray is recommended in 3-4 weeks following trial of antibiotic therapy to ensure resolution and exclude underlying malignancy. Electronically Signed   By: David  [place]  M.D.   On: 04/10/2018 14:10   Ct Head Wo Contrast  Result Date: 04/10/2018 CLINICAL DATA:  Confusion with garbled speech and recent hallucinations EXAM: CT HEAD WITHOUT CONTRAST TECHNIQUE: Contiguous axial images were obtained from the base of the skull through the vertex without intravenous contrast. COMPARISON:  None. FINDINGS: Note that there is a degree of motion artifact. Brain: The ventricles and sulci overall are within normal limits for age. There is asymmetric atrophy along the left anterior temporal lobe compared to the right. There is no evident intracranial mass, hemorrhage, extra-axial fluid collection, or midline shift. There is decreased attenuation at the anterior left superior temporal-frontal junction which is concerning for a potentially recent small infarct. This finding is best appreciated on the coronal images. There is extensive small vessel disease throughout the centra semiovale bilaterally. Decreased attenuation is noted in the anterior limb of the left external capsule as well as in both internal capsules. Small vessel disease is also noted in each lateral thalamus. Vascular: There is no evident hyperdense vessel. There is calcification in each carotid siphon. Skull: Bony calvarium appears intact. Sinuses/Orbits: There is opacification throughout the left frontal sinus. There is  mucosal thickening in multiple ethmoid air cells. There is mucosal thickening throughout much of the left maxillary antrum. Orbits appear symmetric bilaterally. Other: Mastoid air cells are clear. IMPRESSION: 1. Age uncertain focal infarct superior left temporal-left frontal junction on the left, best appreciated on the coronal images, most notably coronal slice 38 series 6. 2.  Extensive supratentorial small vessel disease. 3.  No evident mass or hemorrhage. 4.  There are foci of vascular calcification. 5.  Multifocal paranasal sinus disease. Electronically Signed   By: Bretta Bang III M.D.   On: 04/10/2018 14:28    Marchia Meiers, Medical Student 04/10/2018, 5:57 PM Acting Intern pager: [phone]  FPTS Upper-Level Resident Addendum   I have independently interviewed and examined the patient. I have discussed the above with the original author and agree with their documentation. My edits for correction/addition/clarification are in purple. Please see also any attending notes.    [place] Erienne Spelman, DO PGY-2, [facility] 04/10/2018 6:30 PM  FPTS Service pager: [phone] (text pages welcome through [facility])

## 2018-04-10 NOTE — ED Notes (Signed)
Pt returned from CT °

## 2018-04-10 NOTE — ED Notes (Signed)
Patient transported to CT 

## 2018-04-10 NOTE — ED Triage Notes (Addendum)
Pt to ED from [facility] living, family sts pt has had hallucinations x1 week. Yesterday noticed decline in mental status, and today is not responding appropriately. Can normally respond to questions when asked and talk. Today family states the way her mouth  is hanging open is not normal for her and she is not answering questions. Facility has done lab work for UTI twice in the last few days and has come back negative. Yesterday had fever of 100.3 but was afebrile this morning. Unsure if facility gave anything for fever last night. Hx dementia and parkinsons.

## 2018-04-10 NOTE — ED Notes (Signed)
Prior to I&O catheterization, Pt was noticed to have excessive vaginal discharge. She was given peri care prior to catheterization.

## 2018-04-10 NOTE — ED Notes (Signed)
ED Provider at bedside. 

## 2018-04-10 NOTE — ED Notes (Signed)
Pt put on Purewick upon arrival.   

## 2018-04-11 ENCOUNTER — Encounter (HOSPITAL_COMMUNITY): Payer: Self-pay | Admitting: *Deleted

## 2018-04-11 DIAGNOSIS — G20 Parkinson's disease: Secondary | ICD-10-CM

## 2018-04-11 DIAGNOSIS — F0280 Dementia in other diseases classified elsewhere without behavioral disturbance: Secondary | ICD-10-CM

## 2018-04-11 DIAGNOSIS — R410 Disorientation, unspecified: Secondary | ICD-10-CM

## 2018-04-11 DIAGNOSIS — J189 Pneumonia, unspecified organism: Secondary | ICD-10-CM

## 2018-04-11 LAB — BASIC METABOLIC PANEL
Anion gap: 12 (ref 5–15)
BUN: 10 mg/dL (ref 8–23)
CALCIUM: 8.9 mg/dL (ref 8.9–10.3)
CHLORIDE: 102 mmol/L (ref 98–111)
CO2: 27 mmol/L (ref 22–32)
CREATININE: 0.69 mg/dL (ref 0.44–1.00)
GFR calc non Af Amer: >60 mL/min (ref >60)
Glucose, Bld: 125 mg/dL — ABNORMAL HIGH (ref 70–99)
Potassium: 4.5 mmol/L (ref 3.5–5.1)
SODIUM: 141 mmol/L (ref 135–145)

## 2018-04-11 LAB — CBC
HCT: 46.6 % — ABNORMAL HIGH (ref 36.0–46.0)
Hemoglobin: 15.2 g/dL — ABNORMAL HIGH (ref 12.0–15.0)
MCH: 31.5 pg (ref 26.0–34.0)
MCHC: 32.6 g/dL (ref 30.0–36.0)
MCV: 96.5 fL (ref 78.0–100.0)
PLATELETS: 239 10*3/uL (ref 150–400)
RBC: 4.83 MIL/uL (ref 3.87–5.11)
RDW: 14.1 % (ref 11.5–15.5)
WBC: 8.7 10*3/uL (ref 4.0–10.5)

## 2018-04-11 LAB — TSH: TSH: 2.813 u[IU]/mL (ref 0.350–4.500)

## 2018-04-11 LAB — URINE CULTURE: CULTURE: NO GROWTH

## 2018-04-11 MED ORDER — PIPERACILLIN-TAZOBACTAM 3.375 G IVPB
3.3750 g | Freq: Three times a day (TID) | INTRAVENOUS | Status: AC
  Administered 2018-04-11 – 2018-04-17 (×20): 3.375 g via INTRAVENOUS
  Filled 2018-04-11 (×20): qty 50

## 2018-04-11 NOTE — Evaluation (Signed)
Physical Therapy Evaluation Patient Details Name: Debra Pierce MRN: [id number] DOB: 04-09-1936 Today's Date: 04/11/2018   History of Present Illness  Debra Pierce is a 82 y.o. female presenting with altered mental status. CAP; walked unassisted prior to vertebral compression fx and kyphoplasty recently; undergoing rehab; PMH is significant for Parkinson's Disease with dementia, HTN, HLD, Depression, neuropathy, and GERD.  Clinical Impression   Pt admitted with above diagnosis. Pt currently with functional limitations due to the deficits listed below (see PT Problem List). PTA, pt lived at [facility] where her son's moved her from [place] to [place]. Pt currently requires max A + 2 with transfers. Pt able to follow commands 50% inconsistently. Pt's son reports that pt more alert, talkative and functional prior to this episode of illness at ALF. Pt will benefit from skilled PT to increase their independence and safety with mobility to allow discharge to the venue listed below.       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; Worth considering CIR -- would like to see her progress more and be able to participate before formal Rehab Consult; Will place CIR screen for Admissions Coord to weigh in    Equipment Recommendations  Rolling walker with 5" wheels;3in1 (PT)    Recommendations for Other Services Speech consult(as ordered)     Precautions / Restrictions Precautions Precautions: Back;Fall Precaution Booklet Issued: No Precaution Comments: recent kyphoplasty Restrictions Weight Bearing Restrictions: No      Mobility  Bed Mobility Overal bed mobility: Needs Assistance Bed Mobility: Rolling;Sidelying to Sit Rolling: Total assist;+2 for physical assistance;+2 for safety/equipment Sidelying to sit: Total assist;+2 for physical assistance;+2 for safety/equipment       General bed mobility comments: Total  assist for roll and sidelie to sit; Once EOB, min assist initially for sitting balance, progressing to minguard assist  Transfers Overall transfer level: Needs assistance Equipment used: 2 person hand held assist(and use of gait belt) Transfers: Sit to/from [employer] Transfers Sit to Stand: Max assist;+2 physical assistance;+2 safety/equipment Stand pivot transfers: Max assist;+2 physical assistance;+2 safety/equipment       General transfer comment: Noted once pt saw the recliner, she leaned towards it and made initial attempts to get up; 2 person assist to stand, with noted trunk and LE activation, though weak; close guard of knees for stability; Max assist, especially when weight shifting to pivot to chair  Ambulation/Gait                Stairs            Wheelchair Mobility    Modified Rankin (Stroke Patients Only)       Balance Overall balance assessment: Needs assistance;History of Falls Sitting-balance support: Feet supported Sitting balance-Leahy Scale: Fair     Standing balance support: Bilateral upper extremity supported;During functional activity Standing balance-Leahy Scale: Poor                               Pertinent Vitals/Pain Pain Assessment: Faces Faces Pain Scale: Hurts little more Pain Location: generalized during bed mobility Pain Descriptors / Indicators: Grimacing;Guarding;Moaning Pain Intervention(s): Monitored during session    Home Living Family/patient expects to be discharged to:: Skilled nursing facility Living Arrangements: Other (Comment)(ALF staff) Available Help at Discharge: Available 24 hours/day Type of Home: Assisted living Home Access: Level entry     Home Layout: One level Home Equipment:  Walker - 2 wheels;Wheelchair - manual Additional Comments: pt using w/c at ALF, was using a RW previously before moving into ALF. Was participating with PT at ALF and starting to use RW again with PT PTA     Prior Function Level of Independence: Needs assistance   Gait / Transfers Assistance Needed: pt using w/c at ALF, was using a RW previously before moving into ALF. Was participating with PT at ALF and starting to use RW again with PT PTA per pt's son  ADL's / Homemaking Assistance Needed: assist with some ADLs at ALF per pt's son        Hand Dominance   Dominant Hand: Right    Extremity/Trunk Assessment   Upper Extremity Assessment Upper Extremity Assessment: Defer to OT evaluation RUE Deficits / Details: AROM impaired, PROM WNL LUE Deficits / Details: AROM impaired, PROM WNL    Lower Extremity Assessment Lower Extremity Assessment: Generalized weakness;RLE deficits/detail;LLE deficits/detail;Difficult to assess due to impaired cognition RLE Deficits / Details: ROM hips and knees WFL bilaterally; noted weak, but voluntary quad contraction for knee extension in sitting on command RLE Coordination: decreased fine motor;decreased gross motor LLE Deficits / Details: ROM hips and knees WFL bilaterally; noted weak, but voluntary quad contraction for knee extension in sitting on command LLE Coordination: decreased fine motor;decreased gross motor    Cervical / Trunk Assessment Cervical / Trunk Assessment: Other exceptions Cervical / Trunk Exceptions: Trunk stiffness noted  Communication   Communication: Receptive difficulties;Expressive difficulties  Cognition Arousal/Alertness: Lethargic Behavior During Therapy: Flat affect Overall Cognitive Status: Impaired/Different from baseline Area of Impairment: Orientation;Attention;Memory;Following commands;Safety/judgement;Awareness;Problem solving                 Orientation Level: Disoriented to;Place;Time;Situation   Memory: Decreased short-term memory Following Commands: Follows one step commands inconsistently Safety/Judgement: Decreased awareness of safety;Decreased awareness of deficits   Problem Solving: Slow  processing;Decreased initiation;Requires verbal cues;Requires tactile cues        General Comments      Exercises     Assessment/Plan    PT Assessment Patient needs continued PT services  PT Problem List Decreased strength;Decreased range of motion;Decreased activity tolerance;Decreased balance;Decreased mobility;Decreased coordination;Decreased cognition;Decreased knowledge of use of DME;Decreased safety awareness;Decreased knowledge of precautions;Impaired tone       PT Treatment Interventions DME instruction;Gait training;Functional mobility training;Therapeutic activities;Therapeutic exercise;Balance training;Neuromuscular re-education;Patient/family education;Cognitive remediation    PT Goals (Current goals can be found in the Care Plan section)  Acute Rehab PT Goals Patient Stated Goal: none stated, but she did make movements towards the recliner once she saw it PT Goal Formulation: Patient unable to participate in goal setting Time For Goal Achievement: 04/25/18 Potential to Achieve Goals: Good    Frequency Min 2X/week   Barriers to discharge        Co-evaluation PT/OT/SLP Co-Evaluation/Treatment: Yes Reason for Co-Treatment: For patient/therapist safety PT goals addressed during session: Mobility/safety with mobility OT goals addressed during session: ADL's and self-care;Proper use of Adaptive equipment and DME       AM-PAC PT "6 Clicks" Daily Activity  Outcome Measure Difficulty turning over in bed (including adjusting bedclothes, sheets and blankets)?: Unable Difficulty moving from lying on back to sitting on the side of the bed? : Unable Difficulty sitting down on and standing up from a chair with arms (e.g., wheelchair, bedside commode, etc,.)?: Unable Help needed moving to and from a bed to chair (including a wheelchair)?: Total Help needed walking in hospital room?: Total Help needed climbing 3-5 steps  with a railing? : Total 6 Click Score: 6    End of  Session Equipment Utilized During Treatment: Gait belt Activity Tolerance: Patient tolerated treatment well Patient left: in chair;with call bell/phone within reach;with chair alarm set;with family/visitor present Nurse Communication: Mobility status PT Visit Diagnosis: Other abnormalities of gait and mobility (R26.89);Muscle weakness (generalized) (M62.81);Difficulty in walking, not elsewhere classified (R26.2)    Time: 1610-9604 PT Time Calculation (min) (ACUTE ONLY): 29 min   Charges:   PT Evaluation $PT Eval Moderate Complexity: 1 Mod          Van Clines, [place]  Acute Rehabilitation Services Pager [phone] Office [phone]   Levi Aland 04/11/2018, 3:25 PM

## 2018-04-11 NOTE — Progress Notes (Signed)
SLP Cancellation Note  Patient Details Name: Debra Pierce MRN: [id number] DOB: 1936-02-19   Cancelled treatment:       Reason Eval/Treat Not Completed: Fatigue/lethargy limiting ability to participate. Pt is currently insufficiently arousable for evaluation. RN reports pt has been somnolent since admit. Will continue efforts.  [name], CCC-SLP Speech Language Pathologist [phone]  Debra Pierce 04/11/2018, 11:03 AM

## 2018-04-11 NOTE — Evaluation (Signed)
Occupational Therapy Evaluation Patient Details Name: Debra Pierce MRN: [id number] DOB: 1935/09/28 Today's Date: 04/11/2018    History of Present Illness Debra Pierce is a 82 y.o. female presenting with altered mental status. CAP; walked unassisted prior to vertebral compression fx and kyphoplasty recently; undergoing rehab; PMH is significant for Parkinson's Disease with dementia, HTN, HLD, Depression, neuropathy, and GERD.   Clinical Impression   Pt with decline in function and safety with ADLs and ADL mobility with decrease strength, balance and endurance with hx of Parkinson's and dementia. PTA, pt lived at [facility] where her son's moved her from [place] to [place]. Pt currently requires total A for ADLs, max A with grooming and self feeding, max A + 2 with transfers. Pt able to follow commands 50% inconsistently. Pt's son reports that pt more alert, talkative and functional prior to this episode of illness at ALF. Pt would benefit from acute OT services to address impairments to maximize level of function and safety    Follow Up Recommendations  SNF;Supervision/Assistance - 24 hour    Equipment Recommendations  Other (comment)(TBD at next venue of care)    Recommendations for Other Services       Precautions / Restrictions Precautions Precautions: Back;Fall Precaution Booklet Issued: No Precaution Comments: recent kyphoplasty Restrictions Weight Bearing Restrictions: No      Mobility Bed Mobility Overal bed mobility: Needs Assistance Bed Mobility: Rolling;Sidelying to Sit Rolling: Total assist;+2 for physical assistance;+2 for safety/equipment Sidelying to sit: Total assist;+2 for physical assistance;+2 for safety/equipment       General bed mobility comments: pt sat EOB min guard A for grooming tasks  Transfers Overall transfer level: Needs assistance Equipment used: 2 person hand held assist Transfers: Sit to/from Stand;Stand Pivot Transfers Sit to Stand: Max  assist;+2 physical assistance;+2 safety/equipment Stand pivot transfers: Max assist;+2 physical assistance;+2 safety/equipment            Balance Overall balance assessment: Needs assistance;History of Falls Sitting-balance support: Feet supported Sitting balance-Leahy Scale: Fair     Standing balance support: Bilateral upper extremity supported;During functional activity Standing balance-Leahy Scale: Poor                             ADL either performed or assessed with clinical judgement   ADL Overall ADL's : Needs assistance/impaired Eating/Feeding: Sitting;Maximal assistance Eating/Feeding Details (indicate cue type and reason): due to cognitive deficits Grooming: Wash/dry hands;Wash/dry face;Sitting;Maximal assistance Grooming Details (indicate cue type and reason): max A hand over hand initiation and task continuation Upper Body Bathing: Total assistance   Lower Body Bathing: Total assistance   Upper Body Dressing : Total assistance   Lower Body Dressing: Total assistance   Toilet Transfer: Maximal assistance;+2 for physical assistance;+2 for safety/equipment;Stand-pivot   Toileting- Clothing Manipulation and Hygiene: Total assistance       Functional mobility during ADLs: Maximal assistance;+2 for physical assistance;+2 for safety/equipment;Cueing for safety General ADL Comments: pt able to follow simple commands 50% inconsistently     Vision Baseline Vision/History: Wears glasses Wears Glasses: Reading only Patient Visual Report: No change from baseline       Perception     Praxis      Pertinent Vitals/Pain Pain Assessment: Faces Faces Pain Scale: Hurts little more Pain Location: generalized during bed mobility Pain Descriptors / Indicators: Grimacing;Guarding;Moaning Pain Intervention(s): Limited activity within patient's tolerance;Monitored during session;Repositioned     Hand Dominance Right   Extremity/Trunk Assessment Upper  Extremity Assessment  Upper Extremity Assessment: Generalized weakness RUE Deficits / Details: AROM impaired, PROM WNL LUE Deficits / Details: AROM impaired, PROM WNL   Lower Extremity Assessment Lower Extremity Assessment: Defer to PT evaluation       Communication Communication Communication: Receptive difficulties;Expressive difficulties   Cognition Arousal/Alertness: Lethargic Behavior During Therapy: Flat affect Overall Cognitive Status: Impaired/Different from baseline Area of Impairment: Orientation;Attention;Memory;Following commands;Safety/judgement;Awareness;Problem solving                 Orientation Level: Disoriented to;Place;Time;Situation   Memory: Decreased short-term memory Following Commands: Follows one step commands inconsistently Safety/Judgement: Decreased awareness of safety;Decreased awareness of deficits   Problem Solving: Slow processing;Decreased initiation;Requires verbal cues;Requires tactile cues     General Comments       Exercises     Shoulder Instructions      Home Living Family/patient expects to be discharged to:: Skilled nursing facility Living Arrangements: Other (Comment)(ALF staff) Available Help at Discharge: Available 24 hours/day Type of Home: Assisted living Home Access: Level entry     Home Layout: One level     Bathroom Shower/Tub: Producer, television/film/video: Handicapped height Bathroom Accessibility: Yes How Accessible: Accessible via wheelchair Home Equipment: Walker - 2 wheels;Wheelchair - manual   Additional Comments: pt using w/c at ALF, was using a RW previously before moving into ALF. Was participating with PT at ALF and starting to use RW again with PT PTA      Prior Functioning/Environment Level of Independence: Needs assistance  Gait / Transfers Assistance Needed: pt using w/c at ALF, was using a RW previously before moving into ALF. Was participating with PT at ALF and starting to use RW again  with PT PTA per pt's son ADL's / Homemaking Assistance Needed: assist with some ADLs at ALF per pt's son            OT Problem List: Decreased strength;Decreased activity tolerance;Decreased cognition;Decreased knowledge of use of DME or AE;Impaired balance (sitting and/or standing);Decreased coordination;Decreased safety awareness      OT Treatment/Interventions: Self-care/ADL training;DME and/or AE instruction;Therapeutic activities;Therapeutic exercise;Neuromuscular education;Patient/family education    OT Goals(Current goals can be found in the care plan section) Acute Rehab OT Goals Patient Stated Goal: none stated OT Goal Formulation: With patient/family Time For Goal Achievement: 04/25/18 Potential to Achieve Goals: Good ADL Goals Pt Will Perform Eating: with mod assist;sitting Pt Will Perform Grooming: with mod assist;sitting Pt Will Perform Upper Body Bathing: with max assist;with mod assist;sitting Pt Will Perform Upper Body Dressing: with max assist;with mod assist;sitting Pt Will Transfer to Toilet: with mod assist;stand pivot transfer;bedside commode Additional ADL Goal #1: Pt will complete bed mobility max - mod A to sit EOB in prep for ADLs Additional ADL Goal #2: Pt will follow 3/4 commands with min cues  OT Frequency:     Barriers to D/C: Decreased caregiver support  pt lives at ALF       Co-evaluation PT/OT/SLP Co-Evaluation/Treatment: Yes Reason for Co-Treatment: For patient/therapist safety;Necessary to address cognition/behavior during functional activity;To address functional/ADL transfers   OT goals addressed during session: ADL's and self-care;Proper use of Adaptive equipment and DME      AM-PAC PT "6 Clicks" Daily Activity     Outcome Measure Help from another person eating meals?: A Lot Help from another person taking care of personal grooming?: A Lot Help from another person toileting, which includes using toliet, bedpan, or urinal?: Total Help  from another person bathing (including washing, rinsing, drying)?: Total Help from  another person to put on and taking off regular upper body clothing?: Total Help from another person to put on and taking off regular lower body clothing?: Total 6 Click Score: 8   End of Session Equipment Utilized During Treatment: Gait belt  Activity Tolerance: Patient limited by fatigue;Patient limited by lethargy Patient left: in chair;with call bell/phone within reach;with chair alarm set;with family/visitor present  OT Visit Diagnosis: Unsteadiness on feet (R26.81);Other abnormalities of gait and mobility (R26.89);Muscle weakness (generalized) (M62.81);History of falling (Z91.81);Other symptoms and signs involving cognitive function;Other symptoms and signs involving the nervous system (R29.898)                Time: 2505-3976 OT Time Calculation (min): 36 min Charges:  OT General Charges $OT Visit: 1 Visit OT Evaluation $OT Eval Moderate Complexity: 1 Mod    Galen Manila 04/11/2018, 12:34 PM

## 2018-04-11 NOTE — Progress Notes (Signed)
Rehab Admissions Coordinator Note:  Patient was screened by Clois Dupes for appropriateness for an Inpatient Acute Rehab Consult per PT recommendation. Noted OT rec SNF. Pt currently not at a level to be a candidate for CIR. I will follow her progress.   At this time, we are recommending Skilled Nursing Facility.  Clois Dupes 04/11/2018, 4:06 PM  I can be reached at [phone].

## 2018-04-11 NOTE — Progress Notes (Signed)
Patient up in chair. Called and notified [place] RN that patient needed to be in bed for vascular assessment and placement. Will remove consult. Instructed [place] RN to place another consult when patient is back in bed. VU. Tomasita Morrow, RN VAST

## 2018-04-11 NOTE — Progress Notes (Addendum)
Family Medicine Teaching Service Daily Progress Note Acting Intern Pager: [phone]  Patient name: Debra Pierce Medical record number: [id number] Date of birth: March 20, 1936 Age: 82 y.o. Gender: female  Primary Care Provider: Angela Cox, MD Consultants: none Code Status: Full code   Pt Overview and Major Events to Date:  Altered mental status  Assessment and Plan: Debra Pierce is an 82 year old female who presented with altered mental status. Her PMH is significant for Parkinson's dementia, HTN, HLD, depression, neuropathy   Altered Mental Status: Likely delirium 2/2 to HAP in the setting of chronic Parkinsons dementia. Another possible source is Lumbar Spine because of recent kyphoplasty, however MRI unreadable due to severe motion artifact. Patient  continues to be somnolent with earnest attempts to follow commands and speaking incomprehensibly. TSH within normal limits at 2.8. . Limited exam. Electrolytes within normal limits on BMP.  -repeat MRI of lumbar spine when patient able to tolerate -will continue to monitor for changes in mental status  -continue frequent reorientation with daytime lights and minimal nighttime stimulation  -Swallow eval cancelled: too somnolent for evaluation -f/u blood cultures and urine cultures  -f/u PT/OT recommendations -continue NPO -IV ABX as per below  Right Middle Lobe HAP: Patient remains afebrile overnight. Living in assisted living facility with AMS and possible aspiration qualifies for HAP therapy.Patient does not complain of respiratory symptoms and seems to be breathing comfortably without coughing overnight per nursing report. Received Ceftriaxone and azithromycin in ED. WBCs increased to 8.7 from 7.7 overnight. -start Zosyn IV   Parkinson's Disease with Dementia:  -Pharm suggestion to increasing Sinemet 25-100mg  BID with neurology recommendations, patient scheduled to see neuro later this week.  -continue to hold Aricept in setting of  AMS   Osteoporosis: patient with recent kyphoplasty and compression fracture - Does not appear to be on therapy currently - May require bisphosphonate 10mg  daily and calcium 1200mg  /vitamin D 800 IU  Prolonged QT: EKG with Qtc of 492. - Not currently on any prolonging medications, will continue to avoid and monitor -continue to monitor on telemetry   Neuropathy: Son reports some neuropathy in RLE that patient takes Gabapentin 100 mg TID  -held gabapentin for altered mental status   HTN: patient is hypertensive today but is still NPO 2/2 to mental status.  Son reports that patient is on unknown medication for her BP but that it had been lowered recently.  -will continue to monitor BP and consider starting antihypertensive as indicated  HLD: on Zocor 40mg   -will hold for now until mental status improves.   ?GERD: patient reportedly takes Pepcid 20mg  at home but was not previously on this medication so unsure of its indication  -will hold for now as patient does not complain of GERD symptoms but will continue as needed   Depression: restart home Zoloft once mental status improves, will hold in the setting of AMS   FEN/GI: NPO pending improving mental status, increase fluids to 120mL/hr of D5  PPx: Lovenox   Disposition: pending medical improvement   Subjective:   Per nursing patient continued to be somnolent overnight without clear communication. Sleeps comfortably but moans when you try to communicate with her. Remained NPO overnight.   Objective: Temp:  [97.9 F (36.6 C)-99.3 F (37.4 C)] 98.7 F (37.1 C) (09/10 0940) Pulse Rate:  [81-105] 89 (09/10 0940) Resp:  [14-20] 18 (09/10 0940) BP: (146-174)/(68-91) 146/68 (09/10 0940) SpO2:  [87 %-98 %] 87 % (09/10 0940)  Physical Exam: General: elderly female  lying in bed in NAD  Cardiovascular: normal s1 and s2, no rubs or gallops, radial pulses palpated bilaterally  Respiratory: non labored breathing, limited exam due to  patience's somnolence   Abdomen: soft, apparently NT, +BS throughout all quadrants, no masses palpated  Extremities: moves all 4 extremities  Neuro: somnolent, incomprehensible verbalizing, attempts to follow commands, minimally opens eyes, sensation appears to be intact   Laboratory: Recent Labs  Lab 04/10/18 1126 04/11/18 0801  WBC 7.7 8.7  HGB 15.4* 15.2*  HCT 47.7* 46.6*  PLT 241 239   Recent Labs  Lab 04/10/18 1126 04/11/18 0801  NA 137 141  K 4.3 4.5  CL 101 102  CO2 24 27  BUN 15 10  CREATININE 0.84 0.69  CALCIUM 9.0 8.9  PROT 7.1  --   BILITOT 0.7  --   ALKPHOS 71  --   ALT <5  --   AST 24  --   GLUCOSE 101* 125*    Imaging/Diagnostic Tests:   04/10/18 MRI - Lumbar Spine:  Motional artifact, suggest repeat once patient mental status improves    Marchia Meiers, Medical Student 04/11/2018, 12:21 PM Acting Intern pager: [phone]  FPTS Upper-Level Resident Addendum  I have independently interviewed and examined the patient. I have discussed the above with the original author and agree with their documentation. My edits for correction/addition/clarification are in blue. Please see also any attending notes.   Leland Her, DO PGY-3, [facility] 04/11/2018 12:36 PM  FPTS Service pager: [phone] (text pages welcome through [facility]ION)

## 2018-04-11 NOTE — Progress Notes (Signed)
Pharmacy Antibiotic Note  Debra Pierce is a 82 y.o. female admitted on 04/10/2018 with HCAP.  Pharmacy has been consulted for Zosyn dosing.  Plan: Zosyn 3.375g IV q8h (4 hour infusion). Monitor clinical progress, cultures/sensitivities, renal function, abx plan   Height: 5\' 1"  (154.9 cm) Weight: 145 lb (65.8 kg) IBW/kg (Calculated) : 47.8  Temp (24hrs), Avg:98.5 F (36.9 C), Min:97.9 F (36.6 C), Max:99.3 F (37.4 C)  Recent Labs  Lab 04/10/18 1126 04/10/18 1253 04/10/18 1513 04/11/18 0801  WBC 7.7  --   --  8.7  CREATININE 0.84  --   --  0.69  LATICACIDVEN  --  0.93 1.24  --     Estimated Creatinine Clearance: 48.7 mL/min (by C-G formula based on SCr of 0.69 mg/dL).    Allergies  Allergen Reactions  . Codeine Other (See Comments)    Family stated unknown reaction  . Hydrocodone     Antimicrobials this admission: 9/9 Rocephin >> x 1 9/9 Azith >> x 1 9/10 Zosyn >>   Dose adjustments this admission:  Microbiology results: 9/9 BCx: ngtd 9/9 UCx: sent   Thank you for allowing [place] to participate in this patients care.   Signe Colt, PharmD Please utilize [facility] (under [facility]) for appropriate number for your unit pharmacist. 04/11/2018 1:09 PM

## 2018-04-12 LAB — BASIC METABOLIC PANEL
ANION GAP: 10 (ref 5–15)
BUN: 12 mg/dL (ref 8–23)
CHLORIDE: 104 mmol/L (ref 98–111)
CO2: 24 mmol/L (ref 22–32)
Calcium: 8.5 mg/dL — ABNORMAL LOW (ref 8.9–10.3)
Creatinine, Ser: 0.77 mg/dL (ref 0.44–1.00)
GFR calc non Af Amer: >60 mL/min (ref >60)
Glucose, Bld: 125 mg/dL — ABNORMAL HIGH (ref 70–99)
POTASSIUM: 3.8 mmol/L (ref 3.5–5.1)
SODIUM: 138 mmol/L (ref 135–145)

## 2018-04-12 LAB — GLUCOSE, CAPILLARY: Glucose-Capillary: 116 mg/dL — ABNORMAL HIGH (ref 70–99)

## 2018-04-12 LAB — CBC
HCT: 45.2 % (ref 36.0–46.0)
HEMOGLOBIN: 14.8 g/dL (ref 12.0–15.0)
MCH: 31 pg (ref 26.0–34.0)
MCHC: 32.7 g/dL (ref 30.0–36.0)
MCV: 94.8 fL (ref 78.0–100.0)
Platelets: 225 10*3/uL (ref 150–400)
RBC: 4.77 MIL/uL (ref 3.87–5.11)
RDW: 13.7 % (ref 11.5–15.5)
WBC: 7.8 10*3/uL (ref 4.0–10.5)

## 2018-04-12 LAB — MRSA PCR SCREENING: MRSA BY PCR: NEGATIVE

## 2018-04-12 MED ORDER — CARBIDOPA-LEVODOPA 25-100 MG PO TABS
0.5000 | ORAL_TABLET | Freq: Two times a day (BID) | ORAL | Status: DC
  Administered 2018-04-12 – 2018-04-14 (×4): 0.5
  Filled 2018-04-12 (×4): qty 0.5

## 2018-04-12 MED ORDER — JEVITY 1.2 CAL PO LIQD
1000.0000 mL | ORAL | Status: DC
  Filled 2018-04-12: qty 1000

## 2018-04-12 MED ORDER — CARBIDOPA-LEVODOPA 25-100 MG PO TABS
0.5000 | ORAL_TABLET | Freq: Two times a day (BID) | ORAL | Status: DC
  Filled 2018-04-12: qty 0.5

## 2018-04-12 MED ORDER — JEVITY 1.2 CAL PO LIQD
1000.0000 mL | ORAL | Status: DC
  Administered 2018-04-12: 20 mL
  Administered 2018-04-13 – 2018-04-17 (×5): 1000 mL
  Filled 2018-04-12 (×10): qty 1000

## 2018-04-12 NOTE — Telephone Encounter (Signed)
She was admitted to hospital with pneumonia 9/9 so will not schedule at this time

## 2018-04-12 NOTE — Evaluation (Signed)
Clinical/Bedside Swallow Evaluation Patient Details  Name: Debra Pierce MRN: [id number] Date of Birth: May 04, 1936  Today's Date: 04/12/2018 Time: SLP Start Time (ACUTE ONLY): 0935 SLP Stop Time (ACUTE ONLY): 1000 SLP Time Calculation (min) (ACUTE ONLY): 25 min  Past Medical History:  Past Medical History:  Diagnosis Date  . Benign essential HTN   . Depression   . HLD (hyperlipidemia)   . Neuropathy   . Parkinson's disease dementia [facility])    Past Surgical History:  Past Surgical History:  Procedure Laterality Date  . L5 kyphoplasty Bilateral    HPI:  82 year old female admitted 04/10/18 with AMS, hallucinations, somnolence and difficulty communicating x1 week. PMH: Parkinson's disease, dementia, HTN, HLD, depression, neuropathy, GERD. CXR = RML atelectasis or PNA   Assessment / Plan / Recommendation Clinical Impression  Pt slightly more alert and participatory this morning. Oral care was completed with suction. Pt was nonvocal, and required visual and tactile cues to follow commands. Pt was given individual ice chips, which yielded minimal oral manipulation, suspected delayed swallow reflex, and reduced laryngeal elevation per palpation. Teaspoon boluses of thin liquid were given, with right anterior leakage noted, and suspected delayed swallow reflex. No cough response was elicited following ice chips or teaspoon boluses of water, however, pt is at high aspiration risk given history of Parkinson's disease and dementia, and silent aspiration cannot be determined at bedside.   Recommend continued strict NPO status, and oral care QID, with consideration of non-oral feeding method for nutrition, hydration, and administration of medications. No family present at this time. SLP will continue to follow closely for po readiness, as pt is currently not supported nutritionally.    SLP Visit Diagnosis: Dysphagia, unspecified (R13.10)    Aspiration Risk  Severe aspiration risk;Risk for inadequate  nutrition/hydration    Diet Recommendation NPO;Alternative means - temporary   Medication Administration: Via alternative means    Other  Recommendations Oral Care Recommendations: Oral care QID Other Recommendations: Have oral suction available   Follow up Recommendations (TBD)      Frequency and Duration min 2x/week  2 weeks       Prognosis Prognosis for Safe Diet Advancement: Fair Barriers to Reach Goals: Cognitive deficits;Severity of deficits      Swallow Study   General Date of Onset: 04/10/18 HPI: 82 year old female admitted 04/10/18 with AMS, hallucinations, somnolence and difficulty communicating x1 week. PMH: Parkinson's disease, dementia, HTN, HLD, depression, neuropathy, GERD. CXR = RML atelectasis or PNA Type of Study: Bedside Swallow Evaluation Previous Swallow Assessment: none found Diet Prior to this Study: NPO Temperature Spikes Noted: No Respiratory Status: Room air History of Recent Intubation: No Behavior/Cognition: Cooperative;Lethargic/Drowsy;Requires cueing Oral Cavity Assessment: Within Functional Limits Oral Care Completed by SLP: Yes Oral Cavity - Dentition: Adequate natural dentition Self-Feeding Abilities: Total assist Patient Positioning: Upright in bed Baseline Vocal Quality: Aphonic Volitional Cough: Cognitively unable to elicit Volitional Swallow: Unable to elicit    Oral/Motor/Sensory Function Overall Oral Motor/Sensory Function: (unable to assess. Right anterior leakage during po trials)   Ice Chips Ice chips: Impaired Oral Phase Impairments: Poor awareness of bolus;Reduced labial seal;Reduced lingual movement/coordination Oral Phase Functional Implications: Right anterior spillage Pharyngeal Phase Impairments: Suspected delayed Swallow;Decreased hyoid-laryngeal movement   Thin Liquid Thin Liquid: Impaired Presentation: Spoon Oral Phase Impairments: Reduced labial seal;Reduced lingual movement/coordination;Poor awareness of bolus Oral  Phase Functional Implications: Right anterior spillage Pharyngeal  Phase Impairments: Suspected delayed Swallow;Decreased hyoid-laryngeal movement    Nectar Thick Nectar Thick Liquid: Not tested  Honey Thick Honey Thick Liquid: Not tested   Puree Puree: Not tested   Solid     Solid: Not tested     Debra Pierce, [facility], CCC-SLP Speech Language Pathologist [phone]  Debra Pierce 04/12/2018,10:10 AM

## 2018-04-12 NOTE — Progress Notes (Addendum)
FPTS Interim Progress Note Was called in to update patient's family regarding her status.  Patient was sitting up in chair with core track in place.  Patient was alert and oriented x1 but was able to follow commands and answer questions.  Patient was laughing with family and interactive.  She was able to recognize family members and name them.  Patient also asking for granola bars.    Speech will reevaluate patient in the morning first thing to determine if she needs core track or if she can pass a swallow study to safely have a diet. Family would like to keep it in overnight if she is unable to eat on her own. They also would like for her to get her sinemet.   Patient reports that she is not in any pain at this time.  Cythina Mickelsen, [place], DO 04/12/2018, 6:32 PM PGY-2, [facility] Service pager [phone]

## 2018-04-12 NOTE — Progress Notes (Signed)
Physical Therapy Treatment Patient Details Name: Debra Pierce MRN: [id number] DOB: Dec 15, 1935 Today's Date: 04/12/2018    History of Present Illness Debra Pierce is a 82 y.o. female presenting with altered mental status. CAP; walked unassisted prior to vertebral compression fx and kyphoplasty recently; undergoing rehab; PMH is significant for Parkinson's Disease with dementia, HTN, HLD, Depression, neuropathy, and GERD.    PT Comments    Continuing work on functional mobility and activity tolerance;  Noting imporvements in cognition compared to yesterday's session, including attending to therapist, tracking therapist walking around bed, spoke in short sentences, and able to communicate basic needs; Impulsivity noted, at times, attempting to pull at [facility] (RN notified); Reported she needed to go to the bathroom, so initially assist her to [facility], then to recliner, chair alarm set; will continue to follow acutely.   Follow Up Recommendations  SNF     Equipment Recommendations  Rolling walker with 5" wheels;3in1 (PT)    Recommendations for Other Services       Precautions / Restrictions Precautions Precautions: Back;Fall Precaution Comments: recent kyphoplasty    Mobility  Bed Mobility Overal bed mobility: Needs Assistance Bed Mobility: Supine to Sit     Supine to sit: Mod assist     General bed mobility comments: Pt began to sit straight up on her own, almost getting to full circle-sitting in bed; mod assist to fully elevate trunk to sit and turn hips to square off at EOB  Transfers Overall transfer level: Needs assistance Equipment used: 2 person hand held assist(and use of gait belt) Transfers: Sit to/from Stand;Stand Pivot Transfers Sit to Stand: Max assist;+2 physical assistance;+2 safety/equipment Stand pivot transfers: Max assist;+2 physical assistance;+2 safety/equipment       General transfer comment: 2 person assist to stand, with noted trunk and LE activation,  though weak; close guard of knees for stability; Max assist, especially when weight shifting to pivot to chair  Ambulation/Gait                 Stairs             Wheelchair Mobility    Modified Rankin (Stroke Patients Only)       Balance Overall balance assessment: Needs assistance;History of Falls Sitting-balance support: Feet supported Sitting balance-Leahy Scale: Fair     Standing balance support: Bilateral upper extremity supported;During functional activity Standing balance-Leahy Scale: Poor                              Cognition Arousal/Alertness: Awake/alert(eyes open majority of session) Behavior During Therapy: Flat affect Overall Cognitive Status: Impaired/Different from baseline Area of Impairment: Orientation;Attention;Memory;Following commands;Safety/judgement;Awareness;Problem solving                 Orientation Level: Disoriented to;Place;Time;Situation("in my bedroom") Current Attention Level: Focused Memory: Decreased short-term memory Following Commands: Follows one step commands with increased time Safety/Judgement: Decreased awareness of safety;Decreased awareness of deficits   Problem Solving: Slow processing;Decreased initiation;Requires verbal cues;Requires tactile cues General Comments: tracked therapist walking around the room; Spoke in short sentences; able to express status and wants      Exercises      General Comments General comments (skin integrity, edema, etc.): Pt reported she was cold; noted gown was wet and assist in changing to a clean gown      Pertinent Vitals/Pain Pain Assessment: Faces Faces Pain Scale: Hurts whole lot Pain Location: Crying immediately after transfer to [facility] Pain Descriptors /  Indicators: Grimacing;Crying Pain Intervention(s): Repositioned;Other (comment)(wrapped her in warm blankets end of sessio)    Home Living                      Prior Function             PT Goals (current goals can now be found in the care plan section) Acute Rehab PT Goals Patient Stated Goal: "I want to go" ; "Will you stop playing this game?"; "I'm so cold" PT Goal Formulation: Patient unable to participate in goal setting Time For Goal Achievement: 04/25/18 Potential to Achieve Goals: Good Progress towards PT goals: Progressing toward goals(Slowly)    Frequency    Min 2X/week      PT Plan Current plan remains appropriate    Co-evaluation              AM-PAC PT "6 Clicks" Daily Activity  Outcome Measure  Difficulty turning over in bed (including adjusting bedclothes, sheets and blankets)?: Unable Difficulty moving from lying on back to sitting on the side of the bed? : Unable Difficulty sitting down on and standing up from a chair with arms (e.g., wheelchair, bedside commode, etc,.)?: Unable Help needed moving to and from a bed to chair (including a wheelchair)?: A Lot Help needed walking in hospital room?: Total Help needed climbing 3-5 steps with a railing? : Total 6 Click Score: 7    End of Session Equipment Utilized During Treatment: Gait belt Activity Tolerance: Patient tolerated treatment well Patient left: in chair;with call bell/phone within reach;with chair alarm set(moved chair to face window) Nurse Communication: Mobility status;Other (comment)(Needs Pure Shonna Chock) PT Visit Diagnosis: Other abnormalities of gait and mobility (R26.89);Muscle weakness (generalized) (M62.81);Difficulty in walking, not elsewhere classified (R26.2)     Time: 2130-8657 PT Time Calculation (min) (ACUTE ONLY): 30 min  Charges:  $Therapeutic Activity: 23-37 mins                     Van Clines, PT  Acute Rehabilitation Services Pager [phone] Office [phone]    Levi Aland 04/12/2018, 4:52 PM

## 2018-04-12 NOTE — Progress Notes (Signed)
Cortrak Tube Team Note:  Consult received to place a Cortrak feeding tube.   A 10 F Cortrak tube was placed in the LEFT nare and secured with a nasal bridle at 74 cm. Per the Cortrak monitor reading the tube tip is post-pyloric.   No x-ray is required. RN may begin using tube.   If the tube becomes dislodged please keep the tube and contact the Cortrak team at [email or web address] (password [email or web address]) for replacement.  If after hours and replacement cannot be delayed, place a NG tube and confirm placement with an abdominal x-ray.   Romelle Starcher MS, RD, LDN, CNSC [phone] Pager  [phone] Weekend/On-Call Pager

## 2018-04-12 NOTE — Progress Notes (Addendum)
Family Medicine Teaching Service Daily Progress Note Intern Pager: [phone]  Patient name: Debra Pierce Medical record number: [id number] Date of birth: [birth year] Age: 82 y.o. Gender: female  Primary Care Provider: Angela Cox, MD Consultants: none Code Status: Full   Pt Overview and Major Events to Date:  AMS and HCAP   Assessment and Plan:  Debra Pierce is an 82 year old female presenting with AMS and HCAP.    Altered Mental status 2/2 to HCAP: Another possible source is Lumbar Spine because of recent kyphoplasty. Patient remains mostly unchanged this morning and continues to be somnolent with minimal attempts to follow commands (i.e. Opening eyes but no longer squeezing fingers). Patient is nonverbal with both staff and son this morning. Intermittently tries to climb out of bed during short periods of wakefulness.   -repeat MRI of lumbar spine when patient able to tolerate -will continue to monitor for changes in mental status  -continue frequent reorientation with daytime lights and minimal nighttime stimulation  -Swallow recs: continue NPO with QID oral care & non-oral feeding  -place Cortrak feeding tube with crushed medications  -no growth on cultures  -PT/OT rec: SNF for now -continue NPO -IV ABX as per below  Right Middle Lobe HCAP: Patient remained afebrile overnight. Living in assisted living facility with AMS and possible aspiration qualifies for HCAP therapy.Patient continues to not complain of respiratory symptoms and appears to be breathing comfortably without coughing overnight per nursing report. Received Ceftriaxone and azithromycin in ED. WBCs decreased to 7.8 from 8.7 overnight. -continue Zosyn IV  -continue to monitor for clinical improvement in regard to mental status or infectious symptoms   Parkinson's Disease with Dementia:  -Restart Sinemet 25-100 0.5 tablet BID, crushed via feeding tube  -continue to hold Aricept in setting of AMS   Osteoporosis:  patient with recent kyphoplasty and compression fracture - Does not appear to be on therapy currently - May require bisphosphonate 10mg  daily and calcium 1200mg  /vitamin D 800 IU  Prolonged QT: EKG with Qtc of 492. - Not currently on any prolonging medications, will continue to avoid and monitor - discontinue telemetry today   Neuropathy: Son reports some neuropathy in RLE that patient takes Gabapentin 100 mg TID  - held gabapentin for altered mental status   HTN: patient is hypertensive today but is still NPO 2/2 to mental status.  Son reports that patient is on unknown medication for her BP but that it had been lowered recently.  - will continue to monitor BP and consider starting antihypertensive as indicated  HLD: on Zocor 40mg   -will hold for now until mental status improves.   ?GERD: patient reportedly takes Pepcid 20mg  at home but was not previously on this medication so unsure of its indication  -will hold for now as patient does not complain of GERD symptoms but will continue as needed   Depression: restart home Zoloft once mental status improves, will hold in the setting of AMS   FEN/GI: NPO pending improving mental status, continue mIVFs at 123mL/hr of D5  PPx: Lovenox   Disposition: pending improvement in mental status, PT/OT recommending SNF   Subjective:   Nursing reports that she slept through the evening and has not been awake enough to talk as of yet. Has intermittent episodes of wakefulness when she tries to get out of bed but does not communicate.   Objective: Temp:  [98.3 F (36.8 C)-98.6 F (37 C)] 98.3 F (36.8 C) (09/11 1137) Pulse Rate:  [84-109] 84 (  09/11 1137) Resp:  [16-18] 16 (09/11 1137) BP: (122-145)/(58-75) 128/75 (09/11 1137) SpO2:  [99 %] 99 % (09/11 1137) Weight:  [65.9 kg] 65.9 kg (09/10 2046)  Physical Exam: General: elderly female, sleeping in bed in NAD  Cardiovascular:  Respiratory: non-labored breathing, limited exam  no  crackles or wheezing appreciated Abdomen: soft, +BS  Extremities: moves all, non-edematous   Laboratory: Recent Labs  Lab 04/10/18 1126 04/11/18 0801 04/12/18 0454  WBC 7.7 8.7 7.8  HGB 15.4* 15.2* 14.8  HCT 47.7* 46.6* 45.2  PLT 241 239 225   Recent Labs  Lab 04/10/18 1126 04/11/18 0801 04/12/18 0454  NA 137 141 138  K 4.3 4.5 3.8  CL 101 102 104  CO2 24 27 24   BUN 15 10 12   CREATININE 0.84 0.69 0.77  CALCIUM 9.0 8.9 8.5*  PROT 7.1  --   --   BILITOT 0.7  --   --   ALKPHOS 71  --   --   ALT <5  --   --   AST 24  --   --   GLUCOSE 101* 125* 125*   Imaging/Diagnostic Tests: None   Marchia Meiers, Medical Student 04/12/2018, 12:10 PM Acting [phone]  FPTS Upper-Level Resident Addendum   I have independently interviewed and examined the patient. I have discussed the above with the original author and agree with their documentation. My edits for correction/addition/clarification are in purple. Please see also any attending notes.    [place] Doylene Splinter, DO PGY-2, [facility] 04/12/2018 4:34 PM  FPTS Service pager: [phone] (text pages welcome through AMION)

## 2018-04-12 NOTE — Progress Notes (Signed)
Initial Nutrition Assessment  DOCUMENTATION CODES:   Not applicable  INTERVENTION:   Tube Feeding:  Jevity 1.2 @ 60 ml/hr  Provides 1728 kcals, 80 g of protein and 1166 mL of free water Meets 100% estimated calorie and protein needs   NUTRITION DIAGNOSIS:   Inadequate oral intake related to acute illness, dysphagia, lethargy/confusion as evidenced by NPO status.  GOAL:   Patient will meet greater than or equal to 90% of their needs  MONITOR:   TF tolerance, Diet advancement, Labs, Weight trends  REASON FOR ASSESSMENT:   Consult Enteral/tube feeding initiation and management  ASSESSMENT:   82 yo female admitted AMS, HCAP. PMH includes Parkinson's Disease with Dementia, osteoporosis, HTN, HLD, depression, GERD.   Pt lethargic but arouseable, unable to obtain diet or weight history at this time however. No previous wt encounters.   SLP following, continuing NPO status Cortrak tube placed today, consult for TF received   Labs: reviewed Meds: D5-NS at 100 ml/hr  NUTRITION - FOCUSED PHYSICAL EXAM:    Most Recent Value  Orbital Region  Mild depletion  Upper Arm Region  No depletion  Thoracic and Lumbar Region  No depletion  Buccal Region  Mild depletion  Temple Region  Mild depletion  Clavicle Bone Region  No depletion  Clavicle and Acromion Bone Region  No depletion  Scapular Bone Region  No depletion  Dorsal Hand  No depletion  Patellar Region  No depletion  Anterior Thigh Region  No depletion  Posterior Calf Region  No depletion  Edema (RD Assessment)  Moderate       Diet Order:   Diet Order            Diet NPO time specified  Diet effective now              EDUCATION NEEDS:   Not appropriate for education at this time  Skin:  Skin Assessment: Reviewed RN Assessment  Last BM:  no documented BM  Height:   Ht Readings from Last 1 Encounters:  04/10/18 5\' 1"  (1.549 m)    Weight:   Wt Readings from Last 1 Encounters:  04/11/18 65.9 kg     Ideal Body Weight:     BMI:  Body mass index is 27.45 kg/m.  Estimated Nutritional Needs:   Kcal:  1650-1850 kcals   Protein:  79-92 g  Fluid:  >/= 1.7 L   Romelle Starcher MS, RD, LDN, CNSC [phone] Pager  [phone] Weekend/On-Call Pager

## 2018-04-13 DIAGNOSIS — J189 Pneumonia, unspecified organism: Secondary | ICD-10-CM

## 2018-04-13 LAB — GLUCOSE, CAPILLARY
GLUCOSE-CAPILLARY: 109 mg/dL — AB (ref 70–99)
GLUCOSE-CAPILLARY: 133 mg/dL — AB (ref 70–99)
GLUCOSE-CAPILLARY: 124 mg/dL — AB (ref 70–99)
GLUCOSE-CAPILLARY: 107 mg/dL — AB (ref 70–99)
GLUCOSE-CAPILLARY: 138 mg/dL — AB (ref 70–99)
GLUCOSE-CAPILLARY: 129 mg/dL — AB (ref 70–99)

## 2018-04-13 MED ORDER — SODIUM CHLORIDE 0.9 % IV SOLN
INTRAVENOUS | Status: DC | PRN
  Administered 2018-04-13 – 2018-04-17 (×3): 250 mL via INTRAVENOUS

## 2018-04-13 NOTE — Care Management Note (Addendum)
Case Management Note  Patient Details  Name: Debra Pierce MRN: [id number] Date of Birth: 1935/09/09  Subjective/Objective:               AMS.PMH : Parkinson's Disease with dementia, HTN, HLD, Depression, neuropathy, and GERD. From [facility].  Brian Pierce (Son)     [phone]        PCP: Dr. Leonidas RombergGayanic Dasnayaka  Action/Plan: SLP and PT evaluations pending.... NCM following for transitional care needs.  Expected Discharge Date:  04/13/18               Expected Discharge Plan:     In-House Referral:     Discharge planning Services     Post Acute Care Choice:    Choice offered to:     DME Arranged:    DME Agency:     HH Arranged:    HH Agency:     Status of Service:   in process  If discussed at [employer] Length of Stay Meetings, dates discussed:    Additional Comments:  Epifanio LeschesCole, Fredick Schlosser Hudson, RN 04/13/2018, 3:54 PM

## 2018-04-13 NOTE — Care Management Important Message (Signed)
Important Message  Patient Details  Name: Debra Pierce MRN: [id number] Date of Birth: [birth year]   Medicare Important Message Given:  Yes    Debra Pierce 04/13/2018, 3:56 PM

## 2018-04-13 NOTE — Progress Notes (Signed)
  Speech Language Pathology Treatment: Dysphagia  Patient Details Name: Debra Pierce MRN: [id number] DOB: 09/26/1935 Today'[place] Date: 04/13/2018 Time: [id number] SLP Time Calculation (min) (ACUTE ONLY): 13 min  Assessment / Plan / Recommendation Clinical Impression  Pt continues to exhibit AMS and decreased level of arousal, but pt was able to speak and intermittently follow directions.  Pt was more responsive to son than to SLP.  Pt intermittently followed directions for OME with reduced labial rounding/lateralization noted.  Lingual strength/ROM unable to be assessed; however, seemingly reduced based on dysarthric speech during information conversation (of note, pt is [place], however speech changes do not seem c/w accent).  Oral care provided prior to PO trials.  With thin liquid and puree by spoon there was anterior spillage, delayed oral response, suspected delayed pharyngeal response, and multiple swallows.  Pt was reluctant to accept further PO trials.    Recommend continuing pt remain NPO with alternate means of nutrition, hydration, and medication. Allow small amounts of water by spoon for comfort after good oral care, in moderation, when pt is fully awake/alert, and with supervision/assistance. SLP will continue to follow to determine readiness for PO diet and/or need of instrumental assessment. Education provided to son.    HPI HPI: 82 year old female admitted 04/10/18 with AMS, hallucinations, somnolence and difficulty communicating x1 week. PMH: Parkinson's disease, dementia, HTN, HLD, depression, neuropathy, GERD. CXR = RML atelectasis or PNA       SLP Plan  Continue with current plan of care       Recommendations  Diet recommendations: NPO Medication Administration: Via alternative means                Oral Care Recommendations: Oral care QID Follow up Recommendations: (TBD) SLP Visit Diagnosis: Dysphagia, oropharyngeal phase (R13.12) Plan: Continue with current plan of  care       GO                Debra PleasureLeigh E Namari Breton, MA, CCC-SLP Acute Rehabilitation Services Office: [phone] 04/13/2018, 9:15 AM

## 2018-04-13 NOTE — Progress Notes (Addendum)
Family Medicine Teaching Service Daily Progress Note Intern Pager: [phone]  Patient name: Debra Pierce Medical record number: [id number] Date of birth: May 28, 1936 Age: 82 y.o. Gender: female  Primary Care Provider: Angela Coxasanayaka, Gayani Y, MD Consultants: none  Code Status: Full Code   Pt Overview and Major Events to Date:  AMS and HCAP   Assessment and Plan: Debra Pierce is an 82 year old female presenting with AMS 2/2 to HCAP now with a feeding tube.   Altered mental status, improved. Her speech is difficult to understand but she responds to questions appropriately and follows commands, also more alert with opening eyes and interacting with family members. Initially thought to be due to acute delirium from HCAP however would have expected a more dramatic improvement with IV ABX. May be due to not receiving her home sinemet for the past few days as she does have some cogwheel rigidity and masked facies today.  -SLP repeated swallow study, continue to recommend NPO and feeding tube  -PT recommending SNF upon discharge  -d/c mIVFs -continue to monitor for mental status improvement   HCAP: Patient has no report of coughing and denies dyspnea or SOB. She is breathing on RA at 99%. Patient afebrile overnight and this AM.  -continue Zocsyn with plan to de-escalate once tolerating PO diet   Parkinson's Disease with Dementia:  -Continue Sinemet 25-100 0.5 tablet BID, crushed via feeding tube  -continue to hold Aricept in setting of AMS  Osteoporosis: patient with recent kyphoplasty and compression fracture - Does not appear to be on therapy currently - May require bisphosphonate10mg  dailyand calcium 1200mg /vitamin D800 IU as outpatient  Prolonged QT: EKG with Qtc of 492. - Not currently on any prolonging medications, will continue to avoid and monitor  Neuropathy: Son reports some neuropathy in RLE that patient takes Gabapentin 100 mg TID  - heldgabapentinfor altered mental status    ZHY:QMVHQIOHTN:patient is normotensive now, still NPO 2/2 to mental status and unsuccessful swallow study.   - will continue to monitor BP  HLD:on Zocor 40mg   -will hold for now until mental status improves.  ?GERD:patient reportedly takes Pepcid 20mg  at home but was not previously on this medication so unsure of its indication  -will hold for now as patient does not complain of GERD symptoms but will continue as needed  Depression:Onhome Zoloft. Will restart once mental status improves and able to tolerate PO intake -continue to hold   FEN/GI: Cortrak in place, switch D5 to NS  PPx: Lovenox  Disposition: pending improving mental status, PT recommending SNF upon discharge  Subjective:  Debra Pierce's mental status is improved this morning. Her responsiveness has increased, she was laughing and interacting more with her son this AM and following commands. No adverse events overnight. Patient is tolerating her feeding tube. She denies any pain this AM.   Objective: Temp:  [98.1 F (36.7 C)-99.5 F (37.5 C)] 98.6 F (37 C) (09/12 0900) Pulse Rate:  [74-96] 74 (09/12 0900) Resp:  [16-18] 18 (09/12 0900) BP: (93-152)/(58-131) 142/58 (09/12 0900) SpO2:  [98 %-99 %] 98 % (09/12 0900) Weight:  [65.8 kg] 65.8 kg (09/11 2154)  Physical Exam: General: elderly female in NAD, more alert than previous exams, Cortrak in place  Cardiovascular: systolic murmur, no friction rubs, bilateral radial pulses palpated, trace BLE edema Respiratory: CTAB, no wheezes or crackles noted, non-labored breathing  Abdomen: soft, mildly distended, NT,+BS throughout Extremities: moves all extremities Neuro: following commands, opening eyes spontaneously, responds to interviewer   Laboratory: Recent  Labs  Lab 04/10/18 1126 04/11/18 0801 04/12/18 0454  WBC 7.7 8.7 7.8  HGB 15.4* 15.2* 14.8  HCT 47.7* 46.6* 45.2  PLT 241 239 225   Recent Labs  Lab 04/10/18 1126 04/11/18 0801 04/12/18 0454  NA 137  141 138  K 4.3 4.5 3.8  CL 101 102 104  CO2 24 27 24   BUN 15 10 12   CREATININE 0.84 0.69 0.77  CALCIUM 9.0 8.9 8.5*  PROT 7.1  --   --   BILITOT 0.7  --   --   ALKPHOS 71  --   --   ALT <5  --   --   AST 24  --   --   GLUCOSE 101* 125* 125*   Imaging/Diagnostic Tests:   Marchia Meiers, Medical Student 04/13/2018, 12:01 PM Acting Intern pager: [phone]  FPTS Upper-Level Resident Addendum  I have independently interviewed and examined the patient. I have discussed the above with the original author and agree with their documentation. My edits for correction/addition/clarification are in blue. Please see also any attending notes.   Leland Her, DO PGY-3, [facility] 04/13/2018 12:48 PM  FPTS Service pager: [phone] (text pages welcome through AMION)

## 2018-04-14 LAB — BASIC METABOLIC PANEL
Anion gap: 9 (ref 5–15)
BUN: 8 mg/dL (ref 8–23)
CO2: 28 mmol/L (ref 22–32)
Calcium: 8.5 mg/dL — ABNORMAL LOW (ref 8.9–10.3)
Chloride: 103 mmol/L (ref 98–111)
Creatinine, Ser: 0.55 mg/dL (ref 0.44–1.00)
GFR calc Af Amer: >60 mL/min (ref >60)
GLUCOSE: 131 mg/dL — AB (ref 70–99)
POTASSIUM: 2.8 mmol/L — AB (ref 3.5–5.1)
SODIUM: 140 mmol/L (ref 135–145)

## 2018-04-14 LAB — GLUCOSE, CAPILLARY
GLUCOSE-CAPILLARY: 120 mg/dL — AB (ref 70–99)
GLUCOSE-CAPILLARY: 147 mg/dL — AB (ref 70–99)
GLUCOSE-CAPILLARY: 132 mg/dL — AB (ref 70–99)
Glucose-Capillary: 127 mg/dL — ABNORMAL HIGH (ref 70–99)
Glucose-Capillary: 137 mg/dL — ABNORMAL HIGH (ref 70–99)
Glucose-Capillary: 148 mg/dL — ABNORMAL HIGH (ref 70–99)

## 2018-04-14 LAB — MAGNESIUM: MAGNESIUM: 1.9 mg/dL (ref 1.7–2.4)

## 2018-04-14 MED ORDER — LORAZEPAM 2 MG/ML IJ SOLN: 1.0000 mg | Freq: Once | INTRAMUSCULAR | Status: DC | PRN

## 2018-04-14 MED ORDER — CARBIDOPA-LEVODOPA 25-100 MG PO TABS
0.5000 | ORAL_TABLET | Freq: Three times a day (TID) | ORAL | Status: AC
  Administered 2018-04-15 (×3): 0.5 via ORAL
  Filled 2018-04-14 (×3): qty 0.5

## 2018-04-14 MED ORDER — CARBIDOPA-LEVODOPA 25-100 MG PO TABS: 1.0000 | ORAL_TABLET | Freq: Four times a day (QID) | ORAL | Status: DC

## 2018-04-14 MED ORDER — CARBIDOPA-LEVODOPA 25-100 MG PO TABS
1.0000 | ORAL_TABLET | Freq: Two times a day (BID) | ORAL | Status: DC
  Filled 2018-04-14: qty 1

## 2018-04-14 MED ORDER — CARBIDOPA-LEVODOPA 25-100 MG PO TABS
1.0000 | ORAL_TABLET | Freq: Three times a day (TID) | ORAL | Status: DC
  Filled 2018-04-14: qty 1

## 2018-04-14 MED ORDER — POTASSIUM CHLORIDE 20 MEQ PO PACK
40.0000 meq | PACK | ORAL | Status: AC
  Administered 2018-04-14 (×2): 40 meq via NASOGASTRIC
  Filled 2018-04-14 (×3): qty 2

## 2018-04-14 NOTE — Clinical Social Work Note (Signed)
Clinical Social Work Assessment  Patient Details  Name: Debra Pierce MRN: [id number] Date of Birth: 11-12-1935  Date of referral:  04/14/18               Reason for consult:  Discharge Planning                Permission sought to share information with:  Family Supports Permission granted to share information::  No(Patient disoriented X4)  Name::        Agency::     Relationship::     Contact Information:     Housing/Transportation Living arrangements for the past 2 months:  Assisted Living Facility(Patient has been to [facility] for 3 weeks) Source of Information:  Adult Children(Son Debra at the bedside) Patient Interpreter Needed:  None Criminal Activity/Legal Involvement Pertinent to Current Situation/Hospitalization:  No - Comment as needed Significant Relationships:  Adult Children Lives with:  Facility Resident([facility] ) Do you feel safe going back to the place where you live?  No(Sons in agreement with ST rehab before returning to ALF) Need for family participation in patient care:  Yes (Comment)  Care giving concerns:  Sons Debra Pierce and Debra Pierce are in agreement with patient's need for ST rehab post discharge from hospital.  Social Worker assessment / plan: CSW talked with son Debra Pierce at the bedside regarding discharge disposition for patient. Per Debra Pierce, his brother is currently in [place] and will return to [place] late Sunday night. He is returning to [place] Saturday. Debra Pierce was in bed asleep during the visit. Debra informed CSW that patient lived [place] and 4 to 5 week ago [place] in [place] and [place] for about 2 weeks after being diagnosed with a UTI. [place] [facility] and [facility] for 3 weeks. Debra indicated that he and his brother are in agreement with ST rehab for their mother and a staff person at [facility] will assist them in choosing a facility. CSW provided Debra with a SNF list and explained the facility search process and a  list was left in the drawer in patient's room for [place]. Debra informed CSW that he will contact his brother regarding the list left in the room.  Employment status:  Retired Health and safety inspectornsurance information:  Armed forces operational officerMedicare, Hospital doctorManaged Care(AARP) PT Recommendations:  Skilled Nursing Facility Information / Referral to community resources:  Skilled Nursing Facility(Provided to son Debra Pierce on 9/13 and SNF list left in driawer in patient's room for son Debra Pierce )  Patient/Family's Response to care: Debra Pierce was very complimentary of the staff and how well they care for his mother and wants to talk with the unit director to share his feelings. He also expressed no concerned regarding the care from the doctors during his mother's stay.  Patient/Family's Understanding of and Emotional Response to Diagnosis, Current Treatment, and Prognosis:  Son expressed understanding of his mother's need to be hospitalized and her need for ST rehab.   Emotional Assessment Appearance:  Appears stated age Attitude/Demeanor/Rapport:  Unable to Assess(Patient was asleep) Affect (typically observed):  Unable to Assess Orientation:  (Disoriented X4) Alcohol / Substance use:  Tobacco Use, Alcohol Use, Illicit Drugs(Patient has never smoked and does not currently drink and has never used illicit drugs) Psych involvement (Current and /or in the community):  No (Comment)  Discharge Needs  Concerns to be addressed:  Discharge Planning Concerns Readmission within the last 30 days:  No Current discharge risk:  None Barriers to  Discharge:  Continued Medical Work up   Debra Goldmann, LCSW 04/14/2018, 3:54 PM

## 2018-04-14 NOTE — Progress Notes (Signed)
Palliative Medicine consult noted. Due to high referral volume, there may be a delay seeing this patient. Please call the Palliative Medicine Team office at [phone] if recommendations are needed in the interim.  Thank you for inviting us to see this patient.  Curtistine Pettitt G. Izyk Marty, RN, BSN, CHPN Palliative Medicine Team 04/14/2018 3:15 PM Office [phone] 

## 2018-04-14 NOTE — Progress Notes (Signed)
Spoke with neurology physician on call in regards to Ms. Isaac BlissJersey's decline in mental status, Parkinson's Disease with dementia, and Sinemet dosing and need for neurological recommendations. Dr. Wilford CornerArora agreed to see her at earliest convenience. For now, recommending MRI of head & lumbar spine and will follow up with further recommendations.   Nicki GuadalajaraMakiera Aviella Disbrow, MS4  04/14/18

## 2018-04-14 NOTE — Progress Notes (Addendum)
Family Medicine Teaching Service Daily Progress Note Acting Intern Pager: [phone]  Patient name: Debra Pierce Medical record number: [id number] Date of birth: 1936/07/12 Age: 82 y.o. Gender: female  Primary Care Provider: Angela Coxasanayaka, Gayani Y, MD Consultants: none  Code Status: Full Code   Pt Overview and Major Events to Date:  AMS 2/2 to HCAP now with Cortrak feeds  Assessment and Plan:  [name] is an 82 year old female presenting with AMS 2/2 to HCAP now with tube feedings.   Altered mental status, improved. [name] continues to show improvement, responds appropriately yet slowly to questions, she is weakly following commands and is oriented to self. Patient intermittently becomes somnolent during exam. AMS was thought to be acute delirium 2/2 to her HCAP but has not had dramatic mental status improvement on IV Zosyn, so now raising concern for neurological pathology given her history of Parkinson's. Patient continues to have masked facies with improving rigidity, was off of Carbidopa/Levodopa initially during this admission, but has been restarted at more therapeutic dose.  -neurology consult as patient was to see them outpatient, to weigh in on Parkinson's and new trouble swallowing -SLP will re-evaluate with swallow study   -monitor for continuous improvement in mental status -consider palliative care consult to establish GOC with family  -PT recommending SNF upon discharge  HCAP: Patient has no report of coughing and denies dyspnea or SOB overnight. She is breathing on RA at 99%. Patient continues to be afebrile overnight.  -continue Zosyn day 4/7 with plan to de-escalate once tolerating PO diet  -will continue abx regimen for total of 7 days   Hypokalemia 2/2 to NPO status: K+ down to 2.8 from 3.6 on admission. Magnesium is WNL at 1.9. This is most likely 2/2 to NPO status.  -replete with 40mEq x2 KCl powder via Cortrak  -continue to monitor with AM BMP  - checked magnesium  (1.9)  Parkinson's Disease with Dementia:  -increase Sinemet 25-100 from 0.5 to 1 tabletBID, crushed via feeding tube -continue to hold Aricept in setting of AMS -consult for neurology   Osteoporosis: patient with recent kyphoplasty and compression fracture - Does not appear to be on therapy currently - May require bisphosphonate10mg  dailyand calcium 1200mg /vitamin D800 IU as outpatient  Prolonged QT: EKG with Qtc of 492. - Not currently on any prolonging medications, will continue to avoid and monitor  Neuropathy: Son reports some neuropathy in RLE that patient takes Gabapentin 100 mg TID  - heldgabapentinfor altered mental status   ?UXL:KGMWNUUHTN:patient is normotensive now, still NPO 2/2 to mental status and unsuccessful swallow study. Blood pressure elevations throughout the night noted to be as high as diastolic of 118. Unclear if patient previously treated with antihypertensives, not on any at her assisted living.  - will continue to monitor BP  HLD:on Zocor 40mg   -will hold for now until mental status improves.  ?GERD:patient reportedly takes Pepcid 20mg  at home but was not previously on this medication so unsure of its indication  -will hold for now as patient does not complain of GERD symptoms but will continue as needed  Depression:Onhome Zoloft. Will restart once mental status improves and able to tolerate PO intake -continue to hold   FEN/GI: Cortrak in place, switch D5 to NS  PPx: Lovenox   Disposition: pending improving mental status, PT recommending SNF upon discharge Disposition: pending improvement in mental status   Subjective:  [name] has no new complaints this morning and continues to deny SOB, dyspnea,  Objective: Temp:  [98 F (36.7 C)-98.9 F (37.2 C)] 98.9 F (37.2 C) (09/13 0737) Pulse Rate:  [83-101] 90 (09/13 0737) Resp:  [15-18] 18 (09/13 0737) BP: (116-158)/(62-118) 116/62 (09/13 0737) SpO2:  [93 %-97 %] 96 % (09/13  0737)  Physical Exam: General: elderly fem,ale sitting up in bed in NAD  Cardiovascular: systolic murmur, no rubs, or gallops, pulses palpable, minimal lower extremity edema  Respiratory: no increased work of breathing, CTAB, aerating well  Abdomen: soft, ND, NT, no masses palpated  Extremities: moves all  Neuro: 3/5 strength, sensation appears to be intact, patient following commands and oriented to self, alert with intermittent somnolence throughout exam  Laboratory: Recent Labs  Lab 04/10/18 1126 04/11/18 0801 04/12/18 0454  WBC 7.7 8.7 7.8  HGB 15.4* 15.2* 14.8  HCT 47.7* 46.6* 45.2  PLT 241 239 225   Recent Labs  Lab 04/10/18 1126 04/11/18 0801 04/12/18 0454 04/14/18 0506  NA 137 141 138 140  K 4.3 4.5 3.8 2.8*  CL 101 102 104 103  CO2 24 27 24 28   BUN 15 10 12 8   CREATININE 0.84 0.69 0.77 0.55  CALCIUM 9.0 8.9 8.5* 8.5*  PROT 7.1  --   --   --   BILITOT 0.7  --   --   --   ALKPHOS 71  --   --   --   ALT <5  --   --   --   AST 24  --   --   --   GLUCOSE 101* 125* 125* 131*   Imaging/Diagnostic Tests: none  Marchia Meiers, Medical Student 04/14/2018, 11:12 AM Acting Intern pager: [phone]  FPTS Upper-Level Resident Addendum   I have independently interviewed and examined the patient. I have discussed the above with the original author and agree with their documentation. My edits for correction/addition/clarification are in purple. Please see also any attending notes.    [place] Telesha Deguzman, DO PGY-2, [facility] Family Medicine 04/14/2018 2:48 PM  FPTS Service pager: [phone] (text pages welcome through [facility])

## 2018-04-14 NOTE — Progress Notes (Signed)
Physical Therapy Treatment Patient Details Name: Debra Pierce MRN: [id number] DOB: Mar 09, 1936 Today's Date: 04/14/2018    History of Present Illness Debra Pierce is a 82 y.o. female presenting with altered mental status. CAP; walked unassisted prior to vertebral compression fx and kyphoplasty recently; undergoing rehab; PMH is significant for Parkinson's Disease with dementia, HTN, HLD, Depression, neuropathy, and GERD.    PT Comments    Patient making limited progress towards physical therapy goals. Requiring two person maximal assistance for bed mobility and transfers to partial standing. Does demonstrate the ability to perform static sitting balance without physical assistance. Unable to progress to chair today due to watery diarrhea. Will continue to follow acutely.    Follow Up Recommendations  SNF     Equipment Recommendations  Rolling walker with 5" wheels;3in1 (PT)    Recommendations for Other Services       Precautions / Restrictions Precautions Precautions: Back;Fall Precaution Comments: recent kyphoplasty Restrictions Weight Bearing Restrictions: No    Mobility  Bed Mobility Overal bed mobility: Needs Assistance Bed Mobility: Supine to Sit;Sit to Supine     Supine to sit: Max assist;+2 for safety/equipment Sit to supine: Max assist;+2 for physical assistance   General bed mobility comments: maxA + 2 secondary to decreased cognition. able to long sit in bed without physical assistance  Transfers Overall transfer level: Needs assistance Equipment used: 2 person hand held assist Transfers: Sit to/from Stand Sit to Stand: +2 physical assistance;+2 safety/equipment;Max assist         General transfer comment: max+2 to stand to perform limited peri care but patient unable to achieve full hip extension. bilateral knees blocked for stability. transfer to chair deferred secondary to watery stool upon transfer; returned to supine for bed linen change and clean  up  Ambulation/Gait                 Stairs             Wheelchair Mobility    Modified Rankin (Stroke Patients Only)       Balance Overall balance assessment: Needs assistance;History of Falls Sitting-balance support: Feet supported Sitting balance-Leahy Scale: Fair Sitting balance - Comments: able to statically sit with min guard assist     Standing balance-Leahy Scale: Zero                              Cognition Arousal/Alertness: Lethargic(eyes open towards end of session) Behavior During Therapy: Flat affect Overall Cognitive Status: Impaired/Different from baseline Area of Impairment: Orientation;Attention;Memory;Following commands;Safety/judgement;Awareness;Problem solving                 Orientation Level: Disoriented to;Place;Time;Situation Current Attention Level: Focused Memory: Decreased short-term memory Following Commands: Follows one step commands with increased time;Follows one step commands inconsistently Safety/Judgement: Decreased awareness of safety;Decreased awareness of deficits   Problem Solving: Slow processing;Decreased initiation;Requires verbal cues;Requires tactile cues General Comments: able to express status and wants      Exercises      General Comments        Pertinent Vitals/Pain Pain Assessment: Faces Faces Pain Scale: Hurts even more Pain Location: legs with movement  Pain Descriptors / Indicators: Grimacing;Moaning Pain Intervention(s): Monitored during session;Limited activity within patient's tolerance    Home Living                      Prior Function  PT Goals (current goals can now be found in the care plan section) Acute Rehab PT Goals Patient Stated Goal: unable to state; "please let me go."  PT Goal Formulation: Patient unable to participate in goal setting Time For Goal Achievement: 04/25/18 Potential to Achieve Goals: Fair    Frequency    Min  2X/week      PT Plan Current plan remains appropriate    Co-evaluation              AM-PAC PT "6 Clicks" Daily Activity  Outcome Measure  Difficulty turning over in bed (including adjusting bedclothes, sheets and blankets)?: Unable Difficulty moving from lying on back to sitting on the side of the bed? : Unable Difficulty sitting down on and standing up from a chair with arms (e.g., wheelchair, bedside commode, etc,.)?: Unable Help needed moving to and from a bed to chair (including a wheelchair)?: A Lot Help needed walking in hospital room?: Total Help needed climbing 3-5 steps with a railing? : Total 6 Click Score: 7    End of Session Equipment Utilized During Treatment: Gait belt Activity Tolerance: Patient limited by fatigue Patient left: in bed;with nursing/sitter in room Nurse Communication: Mobility status PT Visit Diagnosis: Other abnormalities of gait and mobility (R26.89);Muscle weakness (generalized) (M62.81);Difficulty in walking, not elsewhere classified (R26.2)     Time: [id number] PT Time Calculation (min) (ACUTE ONLY): 31 min  Charges:  $Therapeutic Activity: 23-37 mins                    Laurina Bustlearoline Kelbi Renstrom, [place] CarolinaPT, DPT Acute Rehabilitation Services Pager [phone] Office [phone]    Vanetta MuldersCarloine H Johngabriel Verde 04/14/2018, 4:52 PM

## 2018-04-14 NOTE — Progress Notes (Signed)
  Speech Language Pathology Treatment: Dysphagia  Patient Details Name: Debra Pierce MRN: [id number] DOB: [birth year] Today's Date: 04/14/2018 Time: 1350-1405 SLP Time Calculation (min) (ACUTE ONLY): 15 min  Assessment / Plan / Recommendation Clinical Impression  Pt is increasingly more alert, verbalizing some today. Attempted trials of ice chips and water. Pt made no attempt to take the ice chip or drink from a straw. Small amount of water placed in pt's mouth was noted to spill anteriorly. Recommend continuing with NPO status. Tube feeding in place.    HPI HPI: 82 year old female admitted 04/10/18 with AMS, hallucinations, somnolence and difficulty communicating x1 week. PMH: Parkinson's disease, dementia, HTN, HLD, depression, neuropathy, GERD. CXR = RML atelectasis or PNA       SLP Plan  Continue with current plan of care       Recommendations  Diet recommendations: NPO Medication Administration: Via alternative means                Oral Care Recommendations: Oral care QID Follow up Recommendations: (TBD) SLP Visit Diagnosis: Dysphagia, oropharyngeal phase (R13.12) Plan: Continue with current plan of care       GO              Debra B. Murvin NatalBueche, [facility] DivisionMSP, CCC-SLP Speech Language Pathologist [phone]  Debra Pierce, Debra Pierce 04/14/2018, 2:21 PM

## 2018-04-14 NOTE — Consult Note (Signed)
NEUROLOGY CONSULT  Reason for Consult: Parkinson's medications Referring Physician: Dr Leveda Anna  HPI: Debra Pierce is an 82 y.o. female with a history of Parkinson's disease, dementia, neuropathy, hyperlipidemia, previous stroke by imaging, tube feedings (Jevity), depression, and essential hypertension admitted to [facility] on 04/10/2018 by the [facility] Teaching service for evaluation of altered mental status.  The patient had a L 5 kyphoplasty performed approximately 1 month ago.  Since then she has been quite debilitated and spends most of her time in a wheelchair.  She also developed a pneumonia now on Zosyn.  A head CT was performed for further evaluation of altered mental status.  This revealed an old infarct but nothing acute.  The patient was admitted for further evaluation and neurology was asked to consult.  One concern is the possibility that her Parkinson's medications need to be adjusted. (Sinemet IR 25-100 mg 0.5 tabs BID PTA)  The patient's son was at the bedside. The patient was unable to give any significant history secondary to her confusion.  Most of the history was obtained from the patient's son. The patient  was living in the [place] area until just recently.  She had seen a neurologist there approximately 5 years ago and been diagnosed with Parkinson's disease and started on Sinemet.  She has had no follow-up since that time.    She had her kyphoplasty at L5 performed approximately 7 to 8 weeks ago in the [place] area.  After that she came down with pneumonia.  The patient's family decided to move her to [place] approximately 3 weeks ago since there are other family members in the area.  According to the patient's son she has been gradually declining.  The physical therapist at [facility] recommended she see a neurologist for her Parkinson's disease.  She actually had an appointment with [facility] on 04/13/2018.  The patient's son  thought this was for her Parkinson's disease; although, it may have been for follow-up regarding her kyphoplasty. The tube feedings were started at the [place] facility.  Approximately 1 week ago the patient began to hallucinate and hospital admission was recommended.  Past Medical History Past Medical History:  Diagnosis Date  . Benign essential HTN   . Depression   . HLD (hyperlipidemia)   . Neuropathy   . Parkinson's disease dementia [facility])     Past Surgical History Past Surgical History:  Procedure Laterality Date  . L5 kyphoplasty Bilateral     Family History History reviewed. No pertinent family history.  Social History    reports that she has never smoked. She has never used smokeless tobacco. She reports that she drank alcohol. She reports that she does not use drugs.  Allergies Allergies  Allergen Reactions  . Codeine Other (See Comments)    Family stated unknown reaction  . Hydrocodone     Home Medications Medications Prior to Admission  Medication Sig Dispense Refill  . carbidopa-levodopa (SINEMET IR) 25-100 MG tablet Take 0.5 tablets by mouth 2 (two) times daily.    . [facility] MELATONIN 3 MG TABS Take 3 mg by mouth at bedtime.   0  . CVS PAIN RELIEF 500 MG tablet Take 1,000 mg by mouth 3 (three) times daily as needed for pain.  0  . donepezil (ARICEPT) 10 MG tablet Take 10 mg by mouth at bedtime.  0  . famotidine (PEPCID) 20 MG tablet Take 20 mg by mouth daily.    Marland Kitchen gabapentin (NEURONTIN) 100 MG capsule  Take 100 mg by mouth 3 (three) times daily.  0  . loperamide (IMODIUM) 2 MG capsule Take 2 mg by mouth every 6 (six) hours as needed for diarrhea or loose stools.    . Menthol, Topical Analgesic, (BIOFREEZE) 4 % GEL Apply 1 application topically daily.    . Multiple Vitamins-Minerals (MULTIVITAMIN WITH MINERALS) tablet Take 1 tablet by mouth daily.    . sertraline (ZOLOFT) 25 MG tablet Take 25 mg by mouth at bedtime.    . simvastatin (ZOCOR) 40 MG tablet Take 40 mg  by mouth at bedtime.      Hospital Medications . carbidopa-levodopa  1 tablet Per Tube BID  . enoxaparin (LOVENOX) injection  40 mg Subcutaneous Q24H   ROS: Unobtainable secondary to confusion  Physical Examination: Vitals:   04/13/18 1719 04/13/18 2013 04/14/18 0436 04/14/18 0737  BP: (!) 158/118 (!) 142/96 (!) 147/76 116/62  Pulse: 93 83 (!) 101 90  Resp: 18 15 16 18   Temp: 98.9 F (37.2 C) 98.9 F (37.2 C) 98 F (36.7 C) 98.9 F (37.2 C)  TempSrc: Oral   Oral  SpO2: 97% 93% 94% 96%  Weight:      Height:        General - this is a thin frail appearing 82 year old female in bed in no acute distress. Heart - Regular rate and rhythm  Lungs - Clear to auscultation anteriorly Abdomen - Soft - non tender Extremities - Distal pulses intact - no edema Skin - Warm and dry  Neurologic Examination:  Mental Status:  The patient tends to stare straight ahead and speaks in one-word answers.  She is not oriented except to self.  She is able to follow some one-step commands. Cranial Nerves:  II-unable to test visual fields III/IV/VI-Pupils were equal and reacted. Extraocular movements were full.  V/VII- the patient notes decreased sensation on the left side of her face VIII-hearing normal.  X-very slow to speak. XII-midline tongue extension  Motor: Right : Upper extremity   3/5    Left:     Upper extremity   3/5  Lower extremity   2/5     Lower extremity  2 /5 Increased tone and cogwheeling worse on the left in both upper and lower extremities Sensory: Intact to light touch in all extremities. Deep Tendon Reflexes: 1/4 throughout Plantars: Downgoing bilaterally  Cerebellar: unable to test. Gait: not tested   LABORATORY STUDIES:  Basic Metabolic Panel: Recent Labs  Lab 04/10/18 1126 04/11/18 0801 04/12/18 0454 04/14/18 0506  NA 137 141 138 140  K 4.3 4.5 3.8 2.8*  CL 101 102 104 103  CO2 24 27 24 28   GLUCOSE 101* 125* 125* 131*  BUN 15 10 12 8   CREATININE 0.84  0.69 0.77 0.55  CALCIUM 9.0 8.9 8.5* 8.5*  MG  --   --   --  1.9    Liver Function Tests: Recent Labs  Lab 04/10/18 1126  AST 24  ALT <5  ALKPHOS 71  BILITOT 0.7  PROT 7.1  ALBUMIN 3.9   No results for input(s): LIPASE, AMYLASE in the last 168 hours. No results for input(s): AMMONIA in the last 168 hours.  CBC: Recent Labs  Lab 04/10/18 1126 04/11/18 0801 04/12/18 0454  WBC 7.7 8.7 7.8  HGB 15.4* 15.2* 14.8  HCT 47.7* 46.6* 45.2  MCV 97.7 96.5 94.8  PLT 241 239 225    Cardiac Enzymes: No results for input(s): CKTOTAL, CKMB, CKMBINDEX, TROPONINI in the last 168  hours.  BNP: Invalid input(s): POCBNP  CBG: Recent Labs  Lab 04/13/18 2012 04/14/18 0014 04/14/18 0435 04/14/18 0740 04/14/18 1206  GLUCAP 129* 127* 148* 137* 120*    Microbiology:   Coagulation Studies: No results for input(s): LABPROT, INR in the last 72 hours.  Urinalysis:  Recent Labs  Lab 04/10/18 1316  COLORURINE YELLOW  LABSPEC 1.019  PHURINE 6.0  GLUCOSEU NEGATIVE  HGBUR SMALL*  BILIRUBINUR NEGATIVE  KETONESUR 5*  PROTEINUR NEGATIVE  NITRITE NEGATIVE  LEUKOCYTESUR NEGATIVE    Lipid Panel:  No results found for: CHOL, TRIG, HDL, CHOLHDL, VLDL, LDLCALC  HgbA1C:  No results found for: HGBA1C  Urine Drug Screen:  No results found for: LABOPIA, COCAINSCRNUR, LABBENZ, AMPHETMU, THCU, LABBARB   Alcohol Level:  No results for input(s): ETH in the last 168 hours.  Miscellaneous labs:  EKG  EKG - SR rate 90 BPM. LBB    IMAGING:  Dg Chest 2 View 04/10/2018 IMPRESSION:  Probable atelectasis or pneumonia in the right middle lobe anteromedially. The findings are accentuated by mild hypoinflation. Followup PA and lateral chest X-ray is recommended in 3-4 weeks following trial of antibiotic therapy to ensure resolution and exclude underlying malignancy.   Dg Abd 1 View 04/10/2018 IMPRESSION:  Nonobstructed gas pattern. Radiopaque snap or support lead at the right cardio  phrenic angle, correlate with physical exam. Otherwise negative for metallic density radiopaque foreign body.   Ct Head Wo Contrast 04/10/2018 IMPRESSION:  1. Age uncertain focal infarct superior left temporal-left frontal junction on the left, best appreciated on the coronal images, most notably coronal slice 38 series 6.  2.  Extensive supratentorial small vessel disease.  3.  No evident mass or hemorrhage.  4.  There are foci of vascular calcification.  5.  Multifocal paranasal sinus disease.   Mr [name] 04/10/2018 IMPRESSION:  Severe motion artifact. Limited exam. Repeat imaging is recommended when patient is able to remain still.   Attending neurologist's note to follow  Debra Seeavid Rinehuls PA-C [facility] Pager [phone] 04/14/2018, 5:16 PM  Attending addendum Patient examined and seen. Agree with the history and physical. My detailed conversation with the son below. Imaging reviewed by me personally.  Assessment/Plan: This is an 82 year old female with a history of Parkinson's disease, dementia, neuropathy, hyperlipidemia, stroke by imaging, recent tube feedings, depression, hypertension, recent L5 kyphoplasty, and pneumonia [facility] for evaluation of altered mental status. A CT of the head revealed a superior left temporal-left frontal junction infarct age uncertain. Neurology has been asked to evaluate the patient's Parkinson's medications since there has been no recent Neurology follow up.follow-up.  PT and OT consults pending.   I spoke with the patient's son, Mr. Debra Pierce, in detail over the phone gathering more history. She has been showing progressive decline over the past few months, more noticeable after her kyphoplasty in June. She is also been complaining of hallucinations specifically seeing "little children" around. Last she walked without a walker or cane was in June and since has not been able to  walk. For Parkinson's she has been only on half a pill of the carbidopa levodopa for many many years. No records were obtainable at this time  My suspicion is that this patient has progressively worsening Parkinson's and a component of Lewy body dementia given the age, Parkinson's and the description of symptoms which has been exacerbated by the current stress of surgery and then infections etc.  Impression -Parkinson's disease -Possible  Lewy body dementia -Toxic metabolic encephalopathy  Recommendations: -I would recommend dosing Sinemet 25/100-half tablets 3 times a day for 1 day followed by 1 tablet 3 times a day for 2 days followed by 1 tablet a day 4 times a day and keep assessing clinically. -Correction of toxic metabolic derangements - Reduction of sedating medications next line-outpatient follow-up with Dr. Lurena Joiner Tat, movement disorder specialist, at [facility] neurology -Physical therapy, occupational therapy as tolerated. -If any sedating medications need to be used and patient is agitated, I would recommend using very low-dose Seroquel-12.5 mg. Neurology service will be available as needed.  Please call with questions.  -- Milon Dikes, MD [facility] Pager: [phone] If 7pm to 7am, please call on call as listed on AMION.

## 2018-04-14 NOTE — Progress Notes (Signed)
Pharmacy Antibiotic Note  Logyn PakistanJersey is a 82 y.o. female admitted on 04/10/2018 with HCAP.  Pharmacy has been consulted for Zosyn dosing. Today is Day #4 of therapy  Plan: Zosyn 3.375g IV q8h (4 hour infusion).  Recommend 7 total days of therapy F/U de-escalation once on PO diet per MD note.  Monitor clinical progress, cultures/sensitivities, renal function, abx plan   Height: 5\' 1"  (154.9 cm) Weight: 145 lb 1 oz (65.8 kg) IBW/kg (Calculated) : 47.8  Temp (24hrs), Avg:98.7 F (37.1 C), Min:98 F (36.7 C), Max:98.9 F (37.2 C)  Recent Labs  Lab 04/10/18 1126 04/10/18 1253 04/10/18 1513 04/11/18 0801 04/12/18 0454 04/14/18 0506  WBC 7.7  --   --  8.7 7.8  --   CREATININE 0.84  --   --  0.69 0.77 0.55  LATICACIDVEN  --  0.93 1.24  --   --   --     Estimated Creatinine Clearance: 48.7 mL/min (by C-G formula based on SCr of 0.55 mg/dL).    Allergies  Allergen Reactions  . Codeine Other (See Comments)    Family stated unknown reaction  . Hydrocodone     Antimicrobials this admission: 9/9 Rocephin >> x 1 9/9 Azith >> x 1 9/10 Zosyn >>   Dose adjustments this admission:  Microbiology results: 9/9 BCx: ngtd 9/9 UCx: sent  Makenzie Weisner A. Jeanella CrazePierce, PharmD, BCPS Clinical Pharmacist [place] Pager: [phone] Please utilize Amion for appropriate phone number to reach the unit pharmacist [facility])   04/14/2018 8:27 AM

## 2018-04-15 ENCOUNTER — Encounter (HOSPITAL_COMMUNITY): Payer: Self-pay | Admitting: Radiology

## 2018-04-15 ENCOUNTER — Inpatient Hospital Stay (HOSPITAL_COMMUNITY): Payer: Medicare Other

## 2018-04-15 DIAGNOSIS — Z515 Encounter for palliative care: Secondary | ICD-10-CM

## 2018-04-15 LAB — CULTURE, BLOOD (ROUTINE X 2)
CULTURE: NO GROWTH
Culture: NO GROWTH
Special Requests: ADEQUATE

## 2018-04-15 LAB — BASIC METABOLIC PANEL
Anion gap: 7 (ref 5–15)
BUN: 12 mg/dL (ref 8–23)
CALCIUM: 8.9 mg/dL (ref 8.9–10.3)
CO2: 30 mmol/L (ref 22–32)
Chloride: 103 mmol/L (ref 98–111)
Creatinine, Ser: 0.64 mg/dL (ref 0.44–1.00)
GFR calc non Af Amer: >60 mL/min (ref >60)
Glucose, Bld: 148 mg/dL — ABNORMAL HIGH (ref 70–99)
POTASSIUM: 4 mmol/L (ref 3.5–5.1)
Sodium: 140 mmol/L (ref 135–145)

## 2018-04-15 LAB — GLUCOSE, CAPILLARY
GLUCOSE-CAPILLARY: 106 mg/dL — AB (ref 70–99)
Glucose-Capillary: 127 mg/dL — ABNORMAL HIGH (ref 70–99)
Glucose-Capillary: 148 mg/dL — ABNORMAL HIGH (ref 70–99)
Glucose-Capillary: 126 mg/dL — ABNORMAL HIGH (ref 70–99)
Glucose-Capillary: 117 mg/dL — ABNORMAL HIGH (ref 70–99)

## 2018-04-15 MED ORDER — LORAZEPAM 2 MG/ML IJ SOLN: 0.5000 mg | Freq: Once | INTRAMUSCULAR | Status: DC | PRN

## 2018-04-15 NOTE — Progress Notes (Addendum)
Family Medicine Teaching Service Daily Progress Note Acting Intern Pager: [phone]  Patient name: Debra Pierce Medical record number: [id number] Date of birth: 08/21/1935 Age: 82 y.o. Gender: female  Primary Care Provider: Angela Coxasanayaka, Gayani Y, MD Consultants: none  Code Status: Full Code   Pt Overview and Major Events to Date:  09/09: Admitted with AMS 2/2 to HCAP  09/11: Failed swallow and started on NG feeds  Assessment and Plan: [name] is an 82 year old female presenting with AMS 2/2 to HCAP now with tube feedings.   Delirium superimposed on dementia, stable: Patient oriented to self only. Able to follow commands. AMS was thought to be acute delirium 2/2 to her HCAP but has not had dramatic mental status improvement on IV Zosyn, so now raising concern for neurological pathology given her history of Parkinson's. Family not present at bedside.  -neurology recommended increase dose of Sinemet and MRI head to r/o acute stroke giving new trouble swallowing -SLP following, appreciate recommendations    -f/u MRI brain and lumbar spine -palliative care consult to establish GOC with family  -PT recommending SNF upon discharge  HCAP: Patient afebrile. She is breathing on RA at 99%.  -continue Zosyn day 5/7 with plan to de-escalate once tolerating PO diet  -will continue abx regimen for total of 7 days   Hypokalemia, resolved: K+ 4.0 on 9/14 after repletion. Magnesium is WNL at 1.9. This is most likely 2/2 to NPO status.  -continue to monitor with AM BMP   Parkinson's Disease with Dementia: Neuro weighed in on Sinemet dosing. See below: -Sinemet 25/100-half tablets 3 times a day for 1 day followed by 1 tablet 3 times a day for 2 days followed by 1 tablet a day 4 times a day  crushed via feeding tube -continue to hold Aricept in setting of AMS  Osteoporosis: patient with recent kyphoplasty and compression fracture - Does not appear to be on therapy currently - May require  bisphosphonate10mg  dailyand calcium 1200mg /vitamin D800 IU as outpatient  Prolonged QT: EKG with Qtc of 492. - Not currently on any prolonging medications, will continue to avoid and monitor  Neuropathy: Son reports some neuropathy in RLE that patient takes Gabapentin 100 mg TID  - heldgabapentinfor altered mental status   ?ZHY:QMVHQIOHTN:patient is normotensive o/n, still NPO 2/2 to mental status and unsuccessful swallow study. Unclear if patient previously treated with antihypertensives, not on any at her assisted living.  - will continue to monitor BP  HLD:on Zocor 40mg   -will hold for now until mental status improves.  ?GERD:patient reportedly takes Pepcid 20mg  at home but was not previously on this medication so unsure of its indication  -will hold for now as patient does not complain of GERD symptoms but will continue as needed  Depression:Onhome Zoloft. Will restart once mental status improves and able to tolerate PO intake -continue to hold   FEN/GI: Cortrak in place PPx: Lovenox   Disposition: pending improving mental status, PT recommending SNF upon discharge Disposition: pending improvement in mental status   Subjective:  Stating she is hot this morning and wants to be set up in the bed. Says she has no pain. No family at bedside.   Objective: Temp:  [98.9 F (37.2 C)-99 F (37.2 C)] 98.9 F (37.2 C) (09/14 0635) Pulse Rate:  [76-95] 76 (09/14 0635) Resp:  [18-20] 20 (09/14 0635) BP: (114-140)/(60-109) 114/60 (09/14 0635) SpO2:  [96 %] 96 % (09/13 1700) Weight:  [60.3 kg] 60.3 kg (09/14 0622)  Physical  Exam: General: NAD, frail, elderly female laying in bed ENTM: Moist mucous membranes, NG in place Cardiovascular: RRR, 2/6 systolic murmur, no LE edema Respiratory: CTA BL, normal work of breathing Gastrointestinal: soft, nontender, nondistended, normoactive BS Derm: no rashes appreciated Neuro: alert and oriented to self only, follows commands    Laboratory: Recent Labs  Lab 04/10/18 1126 04/11/18 0801 04/12/18 0454  WBC 7.7 8.7 7.8  HGB 15.4* 15.2* 14.8  HCT 47.7* 46.6* 45.2  PLT 241 239 225   Recent Labs  Lab 04/10/18 1126  04/12/18 0454 04/14/18 0506 04/15/18 0649  NA 137   < > 138 140 140  K 4.3   < > 3.8 2.8* 4.0  CL 101   < > 104 103 103  CO2 24   < > 24 28 30   BUN 15   < > 12 8 12   CREATININE 0.84   < > 0.77 0.55 0.64  CALCIUM 9.0   < > 8.5* 8.5* 8.9  PROT 7.1  --   --   --   --   BILITOT 0.7  --   --   --   --   ALKPHOS 71  --   --   --   --   ALT <5  --   --   --   --   AST 24  --   --   --   --   GLUCOSE 101*   < > 125* 131* 148*   < > = values in this interval not displayed.   Imaging/Diagnostic Tests: none  [place] Kirstie Larsen, DO PGY-2, [facility] 04/15/2018 8:48 AM  FPTS Service pager: [phone] (text pages welcome through [facility])

## 2018-04-15 NOTE — Progress Notes (Signed)
Consult received. Chart reviewed. Pt unable to participate in assessment. Per documentation in the room, both sons are out of town until Monday morning. Spoke to Dr. [place] with [facility]. Recommended holding on consult until palliative team can meet with sons in person.. Will inform palliative team to arrange mtg on Monday. Thank you, Eduard RouxSarah Somalia Segler, NP

## 2018-04-15 NOTE — Progress Notes (Signed)
Patient screaming lifting head, etc...  per notes pts lumbar was done on 9/9 with motion and they wanted to repeat when patient could hold still, Spoke with RN b4 sending and Drs did not want to give meds.  Patient sent back to room. Floor informed and they will get meds ordered.

## 2018-04-16 LAB — GLUCOSE, CAPILLARY
GLUCOSE-CAPILLARY: 119 mg/dL — AB (ref 70–99)
GLUCOSE-CAPILLARY: 132 mg/dL — AB (ref 70–99)
GLUCOSE-CAPILLARY: 139 mg/dL — AB (ref 70–99)
GLUCOSE-CAPILLARY: 147 mg/dL — AB (ref 70–99)
Glucose-Capillary: 127 mg/dL — ABNORMAL HIGH (ref 70–99)

## 2018-04-16 MED ORDER — CARBIDOPA-LEVODOPA 25-100 MG PO TABS
1.0000 | ORAL_TABLET | Freq: Four times a day (QID) | ORAL | Status: DC
  Filled 2018-04-16 (×2): qty 1

## 2018-04-16 MED ORDER — CARBIDOPA-LEVODOPA 25-100 MG PO TABS
1.0000 | ORAL_TABLET | Freq: Three times a day (TID) | ORAL | Status: DC
  Administered 2018-04-16 – 2018-04-17 (×5): 1
  Filled 2018-04-16 (×6): qty 1

## 2018-04-16 NOTE — Progress Notes (Signed)
Family Medicine Teaching Service Daily Progress Note Acting Intern Pager: [phone]  Patient name: Debra Pierce Medical record number: [id number] Date of birth: July 23, 1936 Age: 82 y.o. Gender: female  Primary Care Provider: Angela Cox, MD Consultants: none  Code Status: Full Code   Pt Overview and Major Events to Date:  09/09: Admitted with AMS 2/2 to HCAP  09/11: Failed swallow and started on NG feeds  Assessment and Plan: [name] is an 82 year old female presenting with AMS 2/2 to HCAP now with tube feedings.   Delirium superimposed on suspected Lewy body dementia, stable:  Patient oriented to self only.  Able to follow some commands but more somnolent than yesterday.  AMS thought to be secondary to acute delirium secondary to H CAP initially but no dramatic improvement after IV antibiotics.  Believed to be related to patient's neurological pathology of Parkinson's disease.  Family not at bedside this a.m.   -monitor on increased dose of sinemet per neuro recs, and ensure outpatient f/u -SLP following, appreciate recommendations    -f/u MRI brain and lumbar spine, may use Ativan for sedation, but avoid if possible -palliative care consult to establish GOC with family on Monday when sons are back in town -PT recommending SNF upon discharge  Protein calorie malnutrition: Patient remains n.p.o. and is unable to swallow safely.  NG tube in place since 9/11.  Albumin on admission on 9/9 was 3.9. -MRI brain pending to determine if patient with acute infarct -Palliative consulted and will meet with family on Monday when they are back in town -SLP following  HCAP: Patient remains afebrile and satting 99% on RA. -continue Zosyn day 6/7 with plan to de-escalate once tolerating PO diet  -will continue abx regimen for total of 7 days   Parkinson's Disease: Neuro weighed in on Sinemet dosing. See below: -Sinemet 25/100-half tablets 3 times a day for 1 day followed by 1 tablet 3  times a day for 2 days followed by 1 tablet a day 4 times a day  crushed via feeding tube -continue to hold Aricept in setting of AMS  Osteoporosis: patient with recent kyphoplasty and compression fracture - Does not appear to be on therapy currently - May require bisphosphonate10mg  dailyand calcium 1200mg /vitamin D800 IU as outpatient  Prolonged QT: EKG with Qtc of 492. - Not currently on any prolonging medications, will continue to avoid and monitor  Neuropathy: Son reports some neuropathy in RLE that patient takes Gabapentin 100 mg TID  - heldgabapentinfor altered mental status   ?JYN:WGNFAOZ is normotensive o/n, still NPO 2/2 to mental status and unsuccessful swallow study. Unclear if patient previously treated with antihypertensives, not on any at her assisted living.  - will continue to monitor BP  HLD:on Zocor 40mg   -will hold for now until mental status improves.  ?GERD:patient reportedly takes Pepcid 20mg  at home but was not previously on this medication so unsure of its indication  -will hold for now as patient does not complain of GERD symptoms but will continue as needed  Depression:Onhome Zoloft. Will restart once mental status improves and able to tolerate PO intake -continue to hold   FEN/GI: Cortrak in place PPx: Lovenox   Disposition: pending improving mental status, PT recommending SNF upon discharge Disposition: pending improvement in mental status   Subjective:  Patient should CAD now when asked if she was uncomfortable but was very hard to awaken.  Objective: Temp:  [98.8 F (37.1 C)-99.3 F (37.4 C)] 98.8 F (37.1 C) (09/15  0453) Pulse Rate:  [81-126] 88 (09/15 0703) Resp:  [18-20] 20 (09/15 0453) BP: (119-145)/(61-117) 145/61 (09/15 0703) SpO2:  [94 %-96 %] 94 % (09/15 0703)  Physical Exam: General: NAD, frail elderly female laying in bed Cardiovascular: RRR, no M/R/G, no LE edema Respiratory: CTA BL in anterior fields,  normal work of breathing Gastrointestinal: soft, nontender, nondistended, normoactive BS MSK: moves 4 extremities equally Derm: no rashes appreciated Neuro: A&O to self, able to follow commands intermittently Psych: Somnolent  Laboratory: Recent Labs  Lab 04/10/18 1126 04/11/18 0801 04/12/18 0454  WBC 7.7 8.7 7.8  HGB 15.4* 15.2* 14.8  HCT 47.7* 46.6* 45.2  PLT 241 239 225   Recent Labs  Lab 04/10/18 1126  04/12/18 0454 04/14/18 0506 04/15/18 0649  NA 137   < > 138 140 140  K 4.3   < > 3.8 2.8* 4.0  CL 101   < > 104 103 103  CO2 24   < > 24 28 30   BUN 15   < > 12 8 12   CREATININE 0.84   < > 0.77 0.55 0.64  CALCIUM 9.0   < > 8.5* 8.5* 8.9  PROT 7.1  --   --   --   --   BILITOT 0.7  --   --   --   --   ALKPHOS 71  --   --   --   --   ALT <5  --   --   --   --   AST 24  --   --   --   --   GLUCOSE 101*   < > 125* 131* 148*   < > = values in this interval not displayed.   Imaging/Diagnostic Tests: none  [place] Vraj Denardo, DO PGY-2, [facility] 04/16/2018 7:30 AM  FPTS Service pager: [phone] (text pages welcome through [facility])

## 2018-04-16 NOTE — [place] FL2 (Signed)
[place] MEDICAID FL2 LEVEL OF CARE SCREENING TOOL     IDENTIFICATION  Patient Name: Debra Pierce Birthdate: Sep 01, 1935 Sex: female Admission Date (Current Location): 04/10/2018  [facility] [address] [facility] [place]   Facility and Address:  [facility], [address], [place], [place] [address]      Provider Number: [id number]  Attending Physician Name and Address:  Moses MannersHensel, William A, MD  Relative Name and Phone Number:  Brian Pierce; son; [phone]    Current Level of Care: Hospital Recommended Level of Care: Skilled Nursing Facility Prior Approval Number:    Date Approved/Denied:   PASRR Number: pending  Discharge Plan: SNF    Current Diagnoses: Patient Active Problem List   Diagnosis Date Noted  . Palliative care encounter   . Community acquired pneumonia   . HCAP (healthcare-associated pneumonia)   . Dementia due to Parkinson's disease without behavioral disturbance (HCC)   . Altered mental status 04/10/2018    Orientation RESPIRATION BLADDER Height & Weight     Self  Normal Incontinent, External catheter Weight: 132 lb 15 oz (60.3 kg) Height:  5\' 1"  (154.9 cm)  BEHAVIORAL SYMPTOMS/MOOD NEUROLOGICAL BOWEL NUTRITION STATUS    (hx of Parkinsons; Dementia) Incontinent Diet(see discharge summary)  AMBULATORY STATUS COMMUNICATION OF NEEDS Skin   Extensive Assist Verbally Normal                       Personal Care Assistance Level of Assistance  Bathing, Feeding, Dressing Bathing Assistance: Maximum assistance Feeding assistance: Maximum assistance Dressing Assistance: Maximum assistance     Functional Limitations Info  Sight, Hearing, Speech Sight Info: Adequate Hearing Info: Adequate Speech Info: Impaired(slurred)    SPECIAL CARE FACTORS FREQUENCY  PT (By licensed PT), OT (By licensed OT)     PT Frequency: 5x week OT Frequency: 5x week            Contractures Contractures Info: Not present    Additional  Factors Info  Code Status, Allergies Code Status Info: Full Code Allergies Info: CODEINE, HYDROCODONE            Current Medications (04/16/2018):  This is the current hospital active medication list Current Facility-Administered Medications  Medication Dose Route Frequency Provider Last Rate Last Dose  . 0.9 %  sodium chloride infusion   Intravenous PRN Moses MannersHensel, William A, MD 10 mL/hr at 04/16/18 0638 250 mL at 04/16/18 [id number]  . acetaminophen (TYLENOL) tablet 650 mg  650 mg Oral Q6H PRN Shirley, [place], DO       Or  . acetaminophen (TYLENOL) suppository 650 mg  650 mg Rectal Q6H PRN Shirley, [place], DO      . [START ON 04/18/2018] carbidopa-levodopa (SINEMET IR) 25-100 MG per tablet immediate release 1 tablet  1 tablet Per Tube QID Shirley, [place], DO      . carbidopa-levodopa (SINEMET IR) 25-100 MG per tablet immediate release 1 tablet  1 tablet Per Tube TID Shirley, [place], DO   1 tablet at 04/16/18 1106  . enoxaparin (LOVENOX) injection 40 mg  40 mg Subcutaneous Q24H Talbert ForestShirley, [place], DO   40 mg at 04/13/18 1724  . feeding supplement (JEVITY 1.2 CAL) liquid 1,000 mL  1,000 mL Per Tube Continuous Jeneen RinksYoo, Elsia J, DO 60 mL/hr at 04/16/18 0441 1,000 mL at 04/16/18 0441  . LORazepam (ATIVAN) injection 0.5 mg  0.5 mg Intravenous Once PRN Talbert ForestShirley, [place], DO      . piperacillin-tazobactam (ZOSYN) IVPB 3.375 g  3.375 g Intravenous Q8H Moses Manners, MD 12.5 mL/hr at 04/16/18 0640 3.375 g at 04/16/18 1610     Discharge Medications: Please see discharge summary for a list of discharge medications.  Relevant Imaging Results:  Relevant Lab Results:   Additional Information SS#[id number] [place], [place]

## 2018-04-16 NOTE — Social Work (Signed)
CSW following for goals of care and PMT meeting.  Aware pt continues to have ng tube, cannot d/c to SNF with this.   [name], LCSWA [facility] Clinical Social Work [phone]

## 2018-04-17 ENCOUNTER — Inpatient Hospital Stay (HOSPITAL_COMMUNITY): Payer: Medicare Other

## 2018-04-17 MED ORDER — CARBIDOPA-LEVODOPA 25-100 MG PO TABS
1.0000 | ORAL_TABLET | Freq: Four times a day (QID) | ORAL | Status: DC
  Administered 2018-04-18 (×2): 1
  Filled 2018-04-17 (×3): qty 1

## 2018-04-17 NOTE — Progress Notes (Addendum)
Family Medicine Teaching Service Daily Progress Note Acting Intern Pager: [phone]  Patient name: Debra Pierce Medical record number: [id number] Date of birth: [birth year] Age: 82 Pierce.o. Gender: female  Primary Care Provider: Angela Pierce, Debra Y, MD Consultants: Neuro, Palliative  Code Status: Full Code   Pt Overview and Major Events to Date:  Progressing Parkinson's with possible Lewy Body dementia & HCAP   Assessment and Plan:  Debra Pierce is an 82 year old female with Parkinson's disease with possible Lewy Body dementia treated for HCAPx7 days, now with tube feeds   Dementia, worsening Parkinson's and suspected Lewy Body dementia: [name] continues to be confused but was noted to be more alert in the afternoons and is tolerating Pierce dysphagia diet as well as crushed medications in applesauce. Of concern is that she did not consume much dietary intake during dinner. Palliative meeting with son, Debra Pierce this morning involved discussion of discontinuation of feeding tube, code status and GOC. Son would like to prioritize quality of life, he is aware that this may be Pierce new baseline and may be end stage of Pierce Parkinson's Disease if she is not able to maintain Pierce nutrition with po feeding. -increase sinemet 25/100 1 tablet 4 times per day starting today -Palliative meeting this morning with son Debra Pierce, discussed GOC and aggressive vs comfort care -continue dysphagia 1 diet  -d/c Cortrak and monitor po intake, if patient does well she may be a SNF candidate. If not they would need to discuss hospice. -f/u with son Debra Pierce(Debra Pierce) on family decisions  -monitoring mental status  -PT/OT consulted -prn low dose 12.5mg  seroquel for agitation  HCAP.Resolved: Patient has no signs of respiratory distress and per nursing had no coughing nor complains of chest pain overnight. Patient remains afebrile and saturating at % on RA.  -completed Zosyn 7 day course -will repeat CXR on 05/15/18 for resolution/rule out  malignancy   Mild Protein calorie malnutrition: Patient with Cortrak in place.Tolerating dysphagia 1 diet. Patient tolerated Pierce Sinemet in applesauce overnight Patient alert but not answering questions this morning. -d/c Cortrak today  -continue dysphagia 1 diet   -SLP following   Parkinson's Disease: patient reported history of Parkinson's disease on 0.5 tablet of Sinemet daily for last 5 years. Patient exhibiting masked facies on exam. Neurology recommended up titration of Sinemet to one whole tablet TID and now 4 times daily.  -sinement 1 tablet 4x daily  Osteoporosis: patient with recent kyphoplasty and compression fracture - Does not appear to be on therapy currently - May require bisphosphonate10mg  dailyand calcium 1200mg /vitamin D800 IUas outpatient. Would not start as patient may be entering Pierce last stage of life.  Neuropathy: Son reports some neuropathy in RLE that patient takes Gabapentin 100 mg TID  - heldgabapentinfor altered mental status   ?JYN:WGNFAOZHTN:patient isnormotensiveo/n,still NPO 2/2 to mental statusand unsuccessful swallow study. Unclear if patient previously treated with antihypertensives, not on any at [facility] assisted living.  - will continue to monitor BP  HLD:on Zocor 40mg   -will hold for now until mental status improves.  Depression:Onhome Zoloft. Will restart oncemental status improvesand able to tolerate PO intake -continue to hold  FEN/GI: dysphagia 1 diet   PPx: Lovenox   Disposition: pending patient po intake as this would determine recommendation for SNF [facility] vs hospice . Regardless she needs palliative follow up  Subjective:   Overnight, [name] was able to tolerate Pierce dysphagia diet including Pierce crushed Sinemet in applesauce. She is reported to have been more alert yesterday afternoon  with continued confusion. This morning she not verbally responsive as in the afternoons but is awake and alert with garbled speech.    GOC meeting held with son, Debra Pierce, this morning that was lead by Palliative care. Patient's son agreed to speak with other son regarding feeding tube and code status and will get back to [place] once they reach a consensus.   Objective: Temp:  [97.7 F (36.5 C)] 97.7 F (36.5 C) (09/16 2105) Pulse Rate:  [101] 101 (09/16 2105) Resp:  [16] 16 (09/16 2105) BP: (134)/(61) 134/61 (09/16 2105)  Physical Exam: General: frail, think elderly female lying in bed with mouth open and Cortrak placed in left nare with hand mitts on  Cardiovascular: systolic murmur, pulses palpable throughout, S1 and S2 audile  Respiratory: CTAB, no wheezes, crackles, patient appears to be breathing comfortably  Abdomen: soft, possible left quadrants tenderness, +BS  Extremities: moves all, no  LE edema, patient in compression stockings  Neuro: patient appears confused this AM, she opening eyes and attempts to verbalize response to questions but interviewer is unable to understand, patient also less responsive to son   Laboratory: Recent Labs  Lab 04/12/18 0454  WBC 7.8  HGB 14.8  HCT 45.2  PLT 225   Recent Labs  Lab 04/12/18 0454 04/14/18 0506 04/15/18 0649  NA 138 140 140  K 3.8 2.8* 4.0  CL 104 103 103  CO2 24 28 30   BUN 12 8 12   CREATININE 0.77 0.55 0.64  CALCIUM 8.5* 8.5* 8.9  GLUCOSE 125* 131* 148*   Imaging/Diagnostic Tests: None today   Debra Pierce, Medical Student 04/18/2018, 12:16 PM Acting Intern pager: [phone]  FPTS Upper-Level Resident Addendum  I have independently interviewed and examined the patient. I have discussed the above with the original author and agree with their documentation. My edits for correction/addition/clarification are in blue. Please see also any attending notes.   Debra Her, DO PGY-3, [facility] 04/18/2018 12:26 PM  FPTS Service pager: [phone] (text pages welcome through AMION)

## 2018-04-17 NOTE — Evaluation (Signed)
   Objective Swallowing Evaluation: MBS-Modified Barium Swallow Study  Patient Details  Name: Debra Pierce MRN: [id number] Date of Birth: 1935/11/30  Today's Date: 04/17/2018 Time:1230  - 1255    Past Medical History:  Past Medical History:  Diagnosis Date  . Benign essential HTN   . Depression   . HLD (hyperlipidemia)   . Neuropathy   . Parkinson's disease dementia [facility](HCC)    Past Surgical History:  Past Surgical History:  Procedure Laterality Date  . L5 kyphoplasty Bilateral    HPI:  82 year old female admitted 04/10/18 with AMS, hallucinations, somnolence and difficulty communicating x1 week. PMH: Parkinson's disease, dementia, HTN, HLD, depression, neuropathy, GERD. CXR = RML atelectasis or PNA      Recommendation/Prognosis  Clinical Impression:    Patient fully alert for evaluation and cooperative. She exhibits moderate oral and mild laryngeal dysphagia. She had moderately impaired bolus manipulation and reduced tongue base retraction with spillover to vallecule with pudding and thin liquid consistencies.  Pt had moderate oral residue with both consistecies as well, however she initiated a second swallow to clear most of remaining bolus. She has reduced pharyngeal paristalisis, reduced laryngeal elevation and anterior movement resulting in flash penetration with thin liquids with a straw only. Cracker was presented but patient spit out and refused to chew after several attempts and cueing. The pill was not given due to safely concerns. Recommend a Dys 1, thin liquid diet, no straws and medication given crushed in puree. Speech therapy to follow up with diet toelrance and upgrade         Swallow Evaluation Recommendations:   Dys 1, thin liquids, meds crushed in puree    Prognosis:  Prognosis for Safe Diet Advancement: Fair Barriers to Reach Goals: Cognitive deficits;Severity of deficits   Individuals Consulted: Consulted and Agree with Results and Recommendations: Patient;RN      SLP Assessment/Plan  Plan:  Speech Therapy Frequency (ACUTE ONLY): min 2x/week      General: Date of Onset: 04/10/18 Type of Study: MBS-Modified Barium Swallow Study Previous Swallow Assessment: none found Diet Prior to this Study: NPO;NG Tube Temperature Spikes Noted: No Respiratory Status: Room air History of Recent Intubation: No Behavior/Cognition: Alert;Cooperative;Pleasant mood;Confused;Requires cueing Oral Cavity - Dentition: Adequate natural dentition Self-Feeding Abilities: Total assist Patient Positioning: Upright in bed Baseline Vocal Quality: Normal Volitional Cough: Cognitively unable to elicit Volitional Swallow: Unable to elicit Anatomy: Within functional limits Pharyngeal Secretions: Not observed secondary MBS          Lindalou HoseSarah J. Shafin Pollio, MA, CCC-SLP 04/17/2018 1:23 PM

## 2018-04-17 NOTE — Progress Notes (Signed)
Palliative:  I missed Ms. Isaac BlissJersey's son, [name], who was here this morning. I called Brain who agrees to meet me in the morning 9/17 08:30 am. His brother has returned to his home in [place] (brother was visiting while [name] was out of town). I will meet and discuss with [name] tomorrow for GOC. Thank you for this consult.   No charge  Yong ChannelAlicia Dorrene Bently, NP Palliative Medicine Team Pager # [phone] (M-F 8a-5p) Team Phone # [phone] (Nights/Weekends)

## 2018-04-17 NOTE — Progress Notes (Signed)
  Speech Language Pathology Treatment: Dysphagia  Patient Details Name: Debra Pierce MRN: [id number] DOB: 20-Oct-1935 Today's Date: 04/17/2018 Time: [id number] SLP Time Calculation (min) (ACUTE ONLY): 22 min  Assessment / Plan / Recommendation Clinical Impression  Patient was asleep when entering room but woke easy to name. Her speech was garbled at first, but there were some recognizable phrases in with babble. She was given ice chips, water by cup and puree. She had slow bolus manipulation with probable delay swallow with all consistencies tested. She had one delayed cough when attempted to talk. Recommend MBS due to patients medical history and cognitive status.    HPI HPI: 82 year old female admitted 04/10/18 with AMS, hallucinations, somnolence and difficulty communicating x1 week. PMH: Parkinson's disease, dementia, HTN, HLD, depression, neuropathy, GERD. CXR = RML atelectasis or PNA       SLP Plan  Continue with current plan of care       Recommendations                   Plan: Continue with current plan of care       GO                Lindalou HoseSarah J. Nikeshia Keetch, MA, CCC-SLP 04/17/2018 11:57 AM

## 2018-04-17 NOTE — Progress Notes (Addendum)
Family Medicine Teaching Service Daily Progress Note Intern Pager: [phone]  Patient name: Debra Pierce Medical record number: [id number] Date of birth: 22-Dec-1935 Age: 82 y.o. Gender: female  Primary Care Provider: Angela Coxasanayaka, Gayani Y, MD Consultants: Neuro Code Status: Full code  Pt Overview and Major Events to Date:  AMS and HCAP  Assessment and Plan: Debra Pierce is an 82 year old female p/w AMS likely 2/2 to HCAP, now with tube feeds   Delirium superimposed on suspected Lewy Body dementia: Overnight, she continues to be confused with intermittent breaks where she is able to state her name, responds appropriately to yes/no questions and follows one step commands.  Concern for new baseline as no dramatic improvement on ABX or on increased sinemet dosing. Hopeful that given slight improvement that patient may be able to swallow, if she does not then she would not be a good candidate for long term PEG feeding. Fortunately she has not been agitated. -Neuro recommended titration up of sinemet, reduction of sedating medications, prn low dose 12.5mg  seroquel for agitation -continue sinemet 25/100 1 tablet TID, plan to increase to 4 times a day on 04/18/18 -SLP consulted, recommended MBS -Palliative meeting with family today to discuss GOC -monitoring mental status  -PT/OT consutled  HCAP resolved: Patient has no signs of respiratory distress and per nursing had no coughing nor complains of chest pain overnight. Patient remains afebrile and saturating at 98% on RA.  -continue Zosyn day 7/7, will discontinue after today's dose as had completed course  Protein calorie malnutrition: Patient with Cortrak in place. Has been NPO for this admission 2/2 to altered mental status and difficulty swallowing. Tolerating well. Patient more alert and able to answer questions this morning. Swallowing still delayed during SLP evaluation.   -Cortrak in place, hopeful to DC this soon -SLP following, recommending  modified barium swallow study   Parkinson's Disease: patient reported history of Parkinson's disease on 0.5 tablet of Sinemet daily for last 5 years. Patient exhibiting some rigidity and masked facies on exam.  -continue Sinemet per Neuro recommendation, today is day 2 of 1 tablet TID  -Neuro: 1/2 tablet TID for 1 day, 1 tablet TID for 3 days, 1 tablet 4x daily -outpatient f/u with movement specialist   Osteoporosis: patient with recent kyphoplasty and compression fracture - Does not appear to be on therapy currently - May require bisphosphonate10mg  dailyand calcium 1200mg /vitamin D800 IUas outpatient  Neuropathy: Son reports some neuropathy in RLE that patient takes Gabapentin 100 mg TID  - heldgabapentinfor altered mental status   ?QVZ:DGLOVFIHTN:patient isnormotensiveo/n,still NPO 2/2 to mental statusand unsuccessful swallow study. Unclear if patient previously treated with antihypertensives, not on any at her assisted living.  - will continue to monitor BP  HLD:on Zocor 40mg   -will hold for now until mental status improves.  Depression:Onhome Zoloft. Will restart oncemental status improvesand able to tolerate PO intake -continue to hold  FEN/GI: Cortrak in place   PPx: Lovenox   Disposition: SNF  Subjective:  Nursing reports that [name] continued to be intermittently confused overnight. She is reportedly to continuously pull down her gown and speak incoherently. She had a moment of calling her sons who were not in the room. This AM, she is oriented to herself but unable to tell where she is in regards to location.   Objective: Temp:  [97.9 F (36.6 C)-98.7 F (37.1 C)] 98.3 F (36.8 C) (09/16 1111) Pulse Rate:  [79-89] 79 (09/16 1111) Resp:  [16-18] 16 (09/16 1111) BP: (99-134)/(65-87)  99/65 (09/16 1111) SpO2:  [98 %-100 %] 99 % (09/16 1111)  Physical Exam:  General: elderly female sitting up in bed in NAD in hand mittens, FlexiSeal in place and  draining Cardiovascular: systolic murmur, no rubs, pulses palpable throughout  Respiratory: CTAB, no wheezing, non-labored breathing, patient appears to be breathing comfortably on RA  Abdomen: soft, NT, ND, +BS Extremities: moves all, trace bilateral LE edema  Neuro: oriented to self, alert, unable to verbalize her location  Laboratory: Recent Labs  Lab 04/11/18 0801 04/12/18 0454  WBC 8.7 7.8  HGB 15.2* 14.8  HCT 46.6* 45.2  PLT 239 225   Recent Labs  Lab 04/12/18 0454 04/14/18 0506 04/15/18 0649  NA 138 140 140  K 3.8 2.8* 4.0  CL 104 103 103  CO2 24 28 30   BUN 12 8 12   CREATININE 0.77 0.55 0.64  CALCIUM 8.5* 8.5* 8.9  GLUCOSE 125* 131* 148*    Imaging/Diagnostic Tests:  Marchia Meiers, Medical Student 04/17/2018, 12:19 PM Acting Intern pager: [phone]   FPTS Upper-Level Resident Addendum  I have independently interviewed and examined the patient. I have discussed the above with the original author and agree with their documentation. My edits for correction/addition/clarification are in blue. Please see also any attending notes.   Leland Her, DO PGY-3, [facility] 04/17/2018 1:57 PM  FPTS Service pager: [phone] (text pages welcome through AMION)

## 2018-04-18 DIAGNOSIS — R638 Other symptoms and signs concerning food and fluid intake: Secondary | ICD-10-CM

## 2018-04-18 DIAGNOSIS — Z7189 Other specified counseling: Secondary | ICD-10-CM

## 2018-04-18 DIAGNOSIS — Z515 Encounter for palliative care: Secondary | ICD-10-CM

## 2018-04-18 MED ORDER — CARBIDOPA-LEVODOPA 25-100 MG PO TABS
1.0000 | ORAL_TABLET | Freq: Four times a day (QID) | ORAL | Status: DC
  Administered 2018-04-18 – 2018-04-21 (×13): 1 via ORAL
  Filled 2018-04-18 (×14): qty 1

## 2018-04-18 MED ORDER — ENSURE ENLIVE PO LIQD
237.0000 mL | Freq: Three times a day (TID) | ORAL | Status: DC
  Administered 2018-04-18 – 2018-04-24 (×11): 237 mL via ORAL

## 2018-04-18 NOTE — Progress Notes (Signed)
Nutrition Follow-up  DOCUMENTATION CODES:   Not applicable  INTERVENTION:   Ensure Enlive po TID, each supplement provides 350 kcal and 20 grams of protein  Magic cup TID with meals, each supplement provides 290 kcal and 9 grams of protein  Feeding assistance and encouragement at meal times  NUTRITION DIAGNOSIS:   Inadequate oral intake related to acute illness, dysphagia, lethargy/confusion as evidenced by NPO status.  Being addressed as diet advanced, supplements ordered  GOAL:   Patient will meet greater than or equal to 90% of their needs  Progressing  MONITOR:   TF tolerance, Diet advancement, Labs, Weight trends  REASON FOR ASSESSMENT:   Consult Enteral/tube feeding initiation and management  ASSESSMENT:   82 yo female admitted AMS, HCAP. PMH includes Parkinson's Disease with Dementia, osteoporosis, HTN, HLD, depression, GERD.    9/16 MBS study, diet advanced to Dysphagia I, Thins  Recorded po intake documented as 0%. Palliative care is following and artificial feeding has been addressed with pt's son. Palliative care is recommending against long term feeding tube. Palliative care also indicated to son that pt will likely have difficulty meeting nutritional needs. Awaiting further decision regarding poc by sons  Cortrak tube removed, TF discontinued. Previously tolerating Jevity 1.2 @ 60 ml/hr, TF infused from 9/11 until 9/17  Labs: reviewed Meds: reviewed  Diet Order:   Diet Order            DIET - DYS 1 Room service appropriate? Yes; Fluid consistency: Thin  Diet effective now              EDUCATION NEEDS:   Not appropriate for education at this time  Skin:  Skin Assessment: Reviewed RN Assessment  Last BM:  rectal tube +9/19  Height:   Ht Readings from Last 1 Encounters:  04/10/18 5\' 1"  (1.549 m)    Weight:   Wt Readings from Last 1 Encounters:  04/15/18 60.3 kg    BMI:  Body mass index is 25.12 kg/m.  Estimated Nutritional  Needs:   Kcal:  1650-1850 kcals   Protein:  79-92 g  Fluid:  >/= 1.7 L   Romelle Starcherate Areen Trautner MS, RD, LDN, CNSC [phone] Pager  [phone] Weekend/On-Call Pager

## 2018-04-18 NOTE — Consult Note (Signed)
Consultation Note Date: 04/18/2018   Patient Name: Debra Pierce  DOB: Sep 08, 1935  MRN: [id number]  Age / Sex: 82 y.o., female  PCP: Merlene Laughter, MD Referring Physician: Zenia Resides, MD  Reason for Consultation: Establishing goals of care  HPI/Patient Profile: 82 y.o. female  with past medical history of Parkinson's Disease with dementia, HTN, HLD, depression, neuropathy, GERD, recent kyphoplasty which has led to decline to wheelchair bound status admitted on 04/10/2018 with altered mental status and treated for RML pneumonia and has increased Sinemet this hospitalization. Unfortunately Debra Pierce and Pierce has not improved and continues much worse than her baseline. Palliative care consulted as family faced with decisions as Debra Pierce and Pierce likely facing EOL.   Clinical Assessment and Goals of Care: I met today with Debra Pierce son, Debra Pierce (outside of room since Debra Pierce and Pierce is unable to participate), along with Dr. Shawna Orleans and medical student Debra Pierce. Debra Pierce was open and receptive to conversation. He has seen his mother's slight decline since her kyphoplasty ~1 month ago in which she had difficult recovery and remained wheelchair bound but recovered enough to be able to feed herself. With her increasing care needs her family decided to relocate her from [place] to [facility] to be closer to Ashley who is more available to be with his mother more often and care facilities more economical.   Debra Pierce has been having discussions with Dr. Shawna Orleans and neurology and was hoping that his mother would improve (as did we) with increased Sinemet dosing. Unfortunately we have not seen much improvement and I expressed my concern that even if she is able to swallow somewhat safely that I fear she will not meet nutrition and hydration needs. We fully discussed artificial feeding (recommended d/c Cortrak to allow for po feeding) and recommended against  long term feeding tube. We discussed aggressive vs comfort care paths including hospice care at EOL. We did discuss code status as well. Debra Pierce will discuss this with his brother. Debra Pierce clearly understands the value of quality of life over quantity of life. He says he has not really had these conversations with his mother (his brother might have had them) and they have had deaths in their family but they have been more sudden and without the complex decision making they are facing now.   Emotional support provided. I will be available to speak with Debra Pierce other son via telephone. I sent electronic Hard Choices and gave Debra Pierce the booklet. Will continue to follow and support. Appreciate Dr. Shawna Orleans and Debra Pierce presence and assistance today as well.   Primary Decision Maker NEXT OF KIN 2 sons, Debra Pierce I met today    SUMMARY OF RECOMMENDATIONS   - Sons discussing aggressive vs comfort paths of care and associated decisions specifically artificial feeding and resuscitation  Code Status/Advance Care Planning:  Full code - Debra Pierce seems inclined towards DNR   Symptom Management:   Recommend d/c Cortrak for comfort and to give trial of po intake.   Anticipate may need medications for dyspnea if comfort  decided or in event of aspiration and also for pain would recommend low dose fentanyl 12.5-25 mcg q2h prn. If decided for comfort with plan to transition to hospice care would recommend roxanol 5 mg SL q4h prn.   Palliative Prophylaxis:   Aspiration, Bowel Regimen, Delirium Protocol, Frequent Pain Assessment, Oral Care and Turn Reposition  Psycho-social/Spiritual:   Desire for further Chaplaincy support:no  Additional Recommendations: Caregiving  Support/Resources, Education on Hospice and Grief/Bereavement Support  Prognosis:   If comfort care is decided she would be eligible for hospice facility.   Discharge Planning: To Be Determined      Primary Diagnoses: Present on Admission: .  Altered mental status   I have reviewed the medical record, interviewed the patient and family, and examined the patient. The following aspects are pertinent.  Past Medical History:  Diagnosis Date  . Benign essential HTN   . Depression   . HLD (hyperlipidemia)   . Neuropathy   . Parkinson's disease dementia [facility])    Social History   Socioeconomic History  . Marital status: Married    Spouse name: Not on file  . Number of children: Not on file  . Years of education: Not on file  . Highest education level: Not on file  Occupational History  . Not on file  Social Needs  . Financial resource strain: Patient refused  . Food insecurity:    Worry: Patient refused    Inability: Patient refused  . Transportation needs:    Medical: Patient refused    Non-medical: Patient refused  Tobacco Use  . Smoking status: Never Smoker  . Smokeless tobacco: Never Used  Substance and Sexual Activity  . Alcohol use: Not Currently  . Drug use: Never  . Sexual activity: Not Currently  Lifestyle  . Physical activity:    Days per week: Patient refused    Minutes per session: Patient refused  . Stress: Not on file  Relationships  . Social connections:    Talks on phone: Patient refused    Gets together: Patient refused    Attends religious service: Patient refused    Active member of club or organization: Patient refused    Attends meetings of clubs or organizations: Patient refused    Relationship status: Patient refused  Other Topics Concern  . Not on file  Social History Narrative  . Not on file   History reviewed. No pertinent family history. Scheduled Meds: . carbidopa-levodopa  1 tablet Per Tube QID  . enoxaparin (LOVENOX) injection  40 mg Subcutaneous Q24H   Continuous Infusions: . sodium chloride Stopped (04/18/18 0347)  . feeding supplement (JEVITY 1.2 CAL) 1,000 mL (04/17/18 0110)   PRN Meds:.sodium chloride, acetaminophen **OR** acetaminophen Allergies  Allergen  Reactions  . Codeine Other (See Comments)    Family stated unknown reaction  . Hydrocodone    Review of Systems  Unable to perform ROS: Acuity of condition    Physical Exam  Constitutional: She appears lethargic. She appears ill.  Thin, frail, elderly  Cardiovascular: Normal rate.  Pulmonary/Chest: Effort normal. No accessory muscle usage. No tachypnea. No respiratory distress.  Abdominal: Normal appearance.  Neurological: She appears lethargic.  More alert and eyes open. Attempts at verbalization but voice garbled. Orientation difficult to fully assess.   Nursing note and vitals reviewed.   Vital Signs: BP 134/61 (BP Location: Left Arm)   Pulse (!) 101   Temp 97.7 F (36.5 C) (Oral)   Resp 16   Ht 5' 1"  (1.549 m)  Wt 60.3 kg   SpO2 99%   BMI 25.12 kg/m  Pain Scale: 0-10   Pain Score: 0-No pain   SpO2: SpO2: 99 % O2 Device:SpO2: 99 % O2 Flow Rate: .   IO: Intake/output summary:   Intake/Output Summary (Last 24 hours) at 04/18/2018 3428 Last data filed at 04/18/2018 0600 Gross per 24 hour  Intake 1313.34 ml  Output 1100 ml  Net 213.34 ml    LBM: Last BM Date: 04/18/18 Baseline Weight: Weight: 65.8 kg Most recent weight: Weight: 60.3 kg     Palliative Assessment/Data: 20%     Time In: 0830 Time Out: 0925 Time Total: 55 min Greater than 50%  of this time was spent counseling and coordinating care related to the above assessment and plan.  Signed by: Vinie Sill, NP Palliative Medicine Team Pager # [phone] (M-F 8a-5p) Team Phone # [phone] (Nights/Weekends)

## 2018-04-18 NOTE — Progress Notes (Signed)
Coretrak has been taken out by [employer]. Will continue to monitor.   Lillia PaulsLaura B. RN

## 2018-04-18 NOTE — Progress Notes (Signed)
Physical Therapy Treatment and Discharge Patient Details Name: Debra Pierce MRN: [id number] DOB: [birth year] Today's Date: 04/18/2018    History of Present Illness Debra Pierce is a 82 y.o. female presenting with altered mental status. CAP; walked unassisted prior to vertebral compression fx and kyphoplasty recently; undergoing rehab; PMH is significant for Parkinson's Disease with dementia, HTN, HLD, Depression, neuropathy, and GERD.    PT Comments    Limited participation today in functional mobility activities; Once sitting EOB, [name] pushed to lie back down; PT intervention for functional mobility at this time is not comfortable, nor is it Palliative, and at this point, PT is not congruent with Goals of Care; Will sign off, and be happy to reconsult for PT with new order if pt and family would like more work on functional mobility; Recommend mechanical lift for OOB to recliner transfers; Discussed with Helmut MusterAlicia, NP with Palliative Care.  Follow Up Recommendations  SNF     Equipment Recommendations  Other (comment)(to be determined at next venue)    Recommendations for Other Services       Precautions / Restrictions Precautions Precautions: Back;Fall Precaution Comments: recent kyphoplasty Restrictions Weight Bearing Restrictions: No    Mobility  Bed Mobility Overal bed mobility: Needs Assistance Bed Mobility: Sit to Supine;Rolling;Sidelying to Sit Rolling: Total assist;+2 for physical assistance;+2 for safety/equipment Sidelying to sit: Total assist;+2 for physical assistance;+2 for safety/equipment       General bed mobility comments: Total assist for all aspects of bed mobility  Transfers                 General transfer comment: Unable to attempt as [name] persistently pushed back into trunk extension while sitting EOB  Ambulation/Gait                 Stairs             Wheelchair Mobility    Modified Rankin (Stroke Patients Only)        Balance     Sitting balance-Leahy Scale: Zero       Standing balance-Leahy Scale: Zero                              Cognition Arousal/Alertness: Lethargic(but arousable) Behavior During Therapy: Flat affect Overall Cognitive Status: Impaired/Different from baseline Area of Impairment: Orientation;Attention;Memory;Following commands;Safety/judgement;Awareness;Problem solving                 Orientation Level: Disoriented to;Place;Time;Situation Current Attention Level: Focused Memory: Decreased short-term memory Following Commands: Follows one step commands with increased time;Follows one step commands inconsistently Safety/Judgement: Decreased awareness of safety;Decreased awareness of deficits            Exercises      General Comments        Pertinent Vitals/Pain Pain Assessment: Faces Faces Pain Scale: Hurts little more Pain Location: general pain and grimace with movement Pain Descriptors / Indicators: Grimacing;Moaning Pain Intervention(s): Monitored during session    Home Living                      Prior Function            PT Goals (current goals can now be found in the care plan section) Acute Rehab PT Goals Patient Stated Goal: Unable to state PT Goal Formulation: Patient unable to participate in goal setting Progress towards PT goals: Not progressing toward goals - comment(Per recs from Palliative Care,  will sign off)    Frequency    Min 2X/week      PT Plan Current plan remains appropriate;Discharge plan needs to be updated(Family considering Hospice; Per conversation with Helmut Muster, NP with Palliative Care)    Co-evaluation              AM-PAC PT "6 Clicks" Daily Activity  Outcome Measure  Difficulty turning over in bed (including adjusting bedclothes, sheets and blankets)?: Unable Difficulty moving from lying on back to sitting on the side of the bed? : Unable Difficulty sitting down on and  standing up from a chair with arms (e.g., wheelchair, bedside commode, etc,.)?: Unable Help needed moving to and from a bed to chair (including a wheelchair)?: Total Help needed walking in hospital room?: Total Help needed climbing 3-5 steps with a railing? : Total 6 Click Score: 6    End of Session Equipment Utilized During Treatment: Gait belt Activity Tolerance: Patient limited by fatigue Patient left: in bed;with bed alarm set(bed in chair position) Nurse Communication: Mobility status PT Visit Diagnosis: Other abnormalities of gait and mobility (R26.89);Muscle weakness (generalized) (M62.81);Difficulty in walking, not elsewhere classified (R26.2)     Time: 0981-1914 PT Time Calculation (min) (ACUTE ONLY): 10 min  Charges:  $Therapeutic Activity: 8-22 mins                     Van Clines, PT  Acute Rehabilitation Services Pager [phone] Office [phone]    Levi Aland 04/18/2018, 2:10 PM

## 2018-04-19 ENCOUNTER — Inpatient Hospital Stay (HOSPITAL_COMMUNITY): Payer: Medicare Other

## 2018-04-19 NOTE — Progress Notes (Signed)
Palliative:  Debra Pierce is more alert today and appears improved. She is eating and drinking a little better than we anticipated. She is also verbalizing and responding appropriately to simple questions and interactions. I spoke with her son, Debra Pierce, who is happy with this improvement. I was able to have a conversation with her other son, Debra Pierce, yesterday as well.   Debra Pierce and I followed up on discussion regarding GOC and QOL. We further discussed feeding tube and resuscitation. Debra Pierce says that they have decided for now to maintain full code given her improvement. We discussed further and I did explain more the expectations IF she were to arrest and be resuscitated. Debra Pierce appreciated the information and agrees that he and his brother might need to reconsider full code. He reports that Debra Pierce is planning to visit (I believe this weekend?) and that he will speak with his brother further about DNR and what they believe their mother would want recognizing the outcomes that we see in similar situations. All questions/concerns addressed. Emotional support provided.   I agree that Debra Pierce would be a good candidate for SNF rehab at this point given her improvement. Sons are open to conversation and they would benefit from continued palliative follow up at SNF. Sons plan to continue discussions regarding these difficult decisions and plan to base their decisions on her QOL at the time the decisions become more immediate. However, Debra Pierce does understand the importance of reconsidering their desire for resuscitation given disease trajectory and poor likelihood of recovery to any sort of QOL in that scenario.   Exam: More alert, verbal and more easily understood. A little restless. Eating and drinking some (but needs encouragement). No distress.   40 min  Yong ChannelAlicia Crystallynn Noorani, NP Palliative Medicine Team Pager # [phone] (M-F 8a-5p) Team Phone # [phone] (Nights/Weekends)

## 2018-04-19 NOTE — Progress Notes (Addendum)
Family Medicine Teaching Service Daily Progress Note Intern Pager: [phone]  Patient name: Debra Pierce Medical record number: [id number] Date of birth: Jul 23, 1936 Age: 82 y.o. Gender: female  Primary Care Provider: Angela Cox, MD Consultants: Neuro, Palliative  Code Status: Full code  Pt Overview and Major Events to Date:  AMS, HCAP  Assessment and Plan: Debra Pierce is an 82 year old female with Parkinson's disease &suspected Lewy Body dementia treated for HCAPx7 days, s/p tube feeds  Dementia 2/2 Parkinson's Disease and suspected Lewy Body Dementia: Improved. Debra Pierce is awake and alert, now conversing with her son and staff, worth noting that her speech is not at her previous BL, tolerating half of her breakfast PO, laughing and asking about her grandchildren. Family expresses interest in rehabilitiation due to concern for patient's inability to walk following kyphoplasty.  -SW consult for SNF placement  -Palliative will continue to follow given decline in Parkinson's and dementia  -continue Sinemet 1 tablet 4x daily -MRI lumbar spine given she is still nonambulatory and with back/leg pain with the recent kyphoplasty  HCAP.Resolved: Patient breathing well on RA.  -completed Zosyn 7 day course -will repeat CXR on 05/15/18 for resolution/rule out malignancy   Mild Protein calorie malnutrition: Patient now tolerating adequate PO intake.  -continue dysphagia 1 diet   -Nutrition consult placed,goal for patient to meet >90% of needs, will provide further recommendations pending family decisions on plan of care going forward  Osteoporosis: patient with recent kyphoplasty and compression fracture. Patient entering EOL care and will not require starting any supplement or medications for this.  -will address any pain 2/2 past fractures and procedures with palliative pain management recommendations - Does not appear to be on therapy currently  Neuropathy: Son reports some  neuropathy in RLE that patient takes Gabapentin 100 mg TID. Patient now more alert but does not appear to be uncomfortable regarding this problem. Will continue to hold unless deemed clinically necessary to restart.  - heldgabapentinfor altered mental status  -will continue PRN   ?JYN:WGNFAOZ continues to benormotensiveo/n 133/72,now tolerating dysphagia diet. Unclear if patient previously treated with antihypertensives, not on any at her assisted living facility.   - will continue to monitor BP  HLD:on Zocor 40mg   -stop zocor considering risk/benefit ratio of continuing this medication due to age >15 and declining mental status    Depression: Previously onhome Zoloft. -continue Zoloft   FEN/GI: dysphagia 1 diet PPx: SCDs  Disposition: pending SNF placement   Subjective:   Overnight, Debra Pierce was reported to have had no events, tolerating adequate PO intake and conversant with staff and family members. This morning she is much improved from previously   Objective: Temp:  [97.9 F (36.6 C)-98.5 F (36.9 C)] 97.9 F (36.6 C) (09/18 0557) Pulse Rate:  [90-101] 91 (09/18 0557) Resp:  [18] 18 (09/18 0557) BP: (121-134)/(66-72) 133/72 (09/18 0557) SpO2:  [93 %-98 %] 93 % (09/18 0557)  Physical Exam: General: frail elderly female sitting up in bed, laughing and talking with son  Cardiovascular: systolic murmur without rubs or gallops, pulses palpable throughout  Respiratory: CTAB, no wheezing, no crackles  Abdomen: soft, ND, +BS throughout Extremities: moves all extremities, no substantial edema or deformities noted  Neuro: patient is alert and oriented to self and location, she is conversant with speech that is 50% comprehensible, patient attempts to follow commands with drastically decreased strength and slowed movements  Laboratory: No results for input(s): WBC, HGB, HCT, PLT in the last 168 hours. Recent  Labs  Lab 04/14/18 0506 04/15/18 0649  NA 140 140  K  2.8* 4.0  CL 103 103  CO2 28 30  BUN 8 12  CREATININE 0.55 0.64  CALCIUM 8.5* 8.9  GLUCOSE 131* 148*   Imaging/Diagnostic Tests: MRI Lumbar spine ordered for inability to walk  Debra MeiersSimmons, Debra Pierce, Medical Student 04/19/2018, 11:41 AM Acting Intern pager: [phone]  FPTS Upper-Level Resident Addendum  I have independently interviewed and examined the patient. I have discussed the above with the original author and agree with their documentation. My edits for correction/addition/clarification are in blue. Please see also any attending notes.   Debra HerElsia J Lashaunda Schild, DO PGY-3, [facility] 04/19/2018 1:29 PM  FPTS Service pager: [phone] (text pages welcome through [facility])

## 2018-04-19 NOTE — Progress Notes (Signed)
  Speech Language Pathology Treatment: Dysphagia  Patient Details Name: Debra Pierce MRN: [id number] DOB: 05-08-1936 Today's Date: 04/19/2018 Time: [id number] SLP Time Calculation (min) (ACUTE ONLY): 21 min  Assessment / Plan / Recommendation Clinical Impression  Pt was alert and cooperative throughout session, and her son, Debra JohnBrian, was present to provide baseline information. He perceived vast improvement in current level of alertness and participation in PO intake compared to yesterday. Given mod-max verbal and tactile cues from the clinician, pt accepted several sips of thin from cup with one immediate coughing episode (suspect due to decreased control and cohesion of bolus) and occasional anterior spillage due to weak labial seal observed. Mild anterior spillage as well as oral holding noted with puree, although pt was responsive to mod verbal cues to manipulate bolus and swallow. Decreased mastication, oral holding, and lingual residue present with regular texture PO. Given max verbal and visual cues, pt did not initiate swallow with regular, requiring liquid wash to deglutate. Given presentation, suspect intermittent episodes of aspiration. SLP educated pt and son regarding aspiration precautions, importance of oral care, and progressive nature of Parkinson's and Lewy Body Dementia future expectations related to swallowing. Recommend continue dysphagia 1 (puree) diet, thin liquids (can try straws) with full supervision to use small bites/sips and reminders to swallow. ST will continue to provide treatment regarding diet safety and efficiency to include opportunities for diet advancement as appropriate.    HPI HPI: 82 year old female admitted 04/10/18 with AMS, hallucinations, somnolence and difficulty communicating x1 week. PMH: Parkinson's disease, dementia, HTN, HLD, depression, neuropathy, GERD. CXR = RML atelectasis or PNA       SLP Plan  Continue with current plan of care       Recommendations   Diet recommendations: Dysphagia 1 (puree);Thin liquid Liquids provided via: Cup;Teaspoon(allowed to attempt straw) Medication Administration: Crushed with puree Supervision: Full supervision/cueing for compensatory strategies;Staff to assist with self feeding Compensations: Slow rate;Small sips/bites;Multiple dry swallows after each bite/sip;Lingual sweep for clearance of pocketing;Minimize environmental distractions;Monitor for anterior loss Postural Changes and/or Swallow Maneuvers: Seated upright 90 degrees                Oral Care Recommendations: Oral care QID Follow up Recommendations: Skilled Nursing facility SLP Visit Diagnosis: Dysphagia, oropharyngeal phase (R13.12) Plan: Continue with current plan of care       Suzzette RighterErin Yashas Camilli, Student SLP                 Suzzette RighterErin Fayetta Sorenson 04/19/2018, 11:22 AM

## 2018-04-19 NOTE — Progress Notes (Signed)
Patient spit out all her medicines since this morning.Not eating her meals as per reported by the n.t.

## 2018-04-19 NOTE — Progress Notes (Signed)
PIV infiltrated.Paged i.v team.

## 2018-04-20 ENCOUNTER — Inpatient Hospital Stay (HOSPITAL_COMMUNITY): Payer: Medicare Other

## 2018-04-20 DIAGNOSIS — Z9889 Other specified postprocedural states: Secondary | ICD-10-CM

## 2018-04-20 DIAGNOSIS — M4850XA Collapsed vertebra, not elsewhere classified, site unspecified, initial encounter for fracture: Secondary | ICD-10-CM

## 2018-04-20 DIAGNOSIS — R29898 Other symptoms and signs involving the musculoskeletal system: Secondary | ICD-10-CM

## 2018-04-20 MED ORDER — STARCH (THICKENING) PO POWD
ORAL | Status: DC | PRN
  Filled 2018-04-20 (×2): qty 227

## 2018-04-20 MED ORDER — ORAL CARE MOUTH RINSE
15.0000 mL | Freq: Two times a day (BID) | OROMUCOSAL | Status: DC
  Administered 2018-04-20 – 2018-04-24 (×8): 15 mL via OROMUCOSAL

## 2018-04-20 NOTE — Progress Notes (Signed)
Modified Barium Swallow Progress Note  Patient Details  Name: Debra Pierce MRN: [id number] Date of Birth: Mar 13, 1936  Today's Date: 04/20/2018  Modified Barium Swallow completed.  Full report located under Chart Review in the Imaging Section.  Brief recommendations include the following:  Clinical Impression  Pt presented with moderate oropharyngeal dysphagia, characterized by anterior loss, decreased control, cohesion and formation of bolus, penetration and silent penetration. Given pt's cogntive impairments secondary to dx dementia, she required max verbal cues to accept nectar, thin, and puree PO's (cues not to phonate during or after swallow). Anterior loss, lingual residue and decreased formation/cohesion noted across textures. Oral holding also observed with puree. Instances of silent penetration on anterior laryngeal vestibule with thin liquids were the result of decreased timing and coordination of protective mechanisms. Given airway intrusion with absence of sensation with thin and risk recommend continue nectar thick liquids, puree texture, crushed meds, upright position, no distractions, check oral cavity and full supervision/assist.    Swallow Evaluation Recommendations       SLP Diet Recommendations: Dysphagia 1 (Puree) solids;Nectar thick liquid   Liquid Administration via: Cup;Straw   Medication Administration: Crushed with puree   Supervision: Full assist for feeding;Full supervision/cueing for compensatory strategies   Compensations: Minimize environmental distractions;Lingual sweep for clearance of pocketing;Small sips/bites;Slow rate;Monitor for anterior loss;Multiple dry swallows after each bite/sip   Postural Changes: Seated upright at 90 degrees   Oral Care Recommendations: Oral care BID        Royce MacadamiaLitaker, Abrian Hanover Willis 04/20/2018,3:23 PM  Breck CoonsLisa Willis Lonell FaceLitaker M.Ed Nurse, children'sCCC-SLP Speech-Language Pathologist Pager [phone] Office [phone]

## 2018-04-20 NOTE — Clinical Social Work Note (Signed)
CSW talked with son in person earlier today regarding his mother's discharge and facility responses rec'd provided and his questions answered.  CSW also texted son (with his permission) at 5:21 pm and provided additional facility responses. CSW also contacted MD again requesting that 30-day note be signed. This was done and clinicals were faxed to [facility]. CSW will f/u with Mr. PakistanJersey tomorrow regarding SNF decision, and once PASRR number received, patient will be able to discharge to a skilled facility.  Genelle BalVanessa Devontre Siedschlag, MSW, LCSW Licensed Clinical Social Worker Clinical Social Work Department [facility] [phone]

## 2018-04-20 NOTE — Progress Notes (Signed)
Family Medicine Teaching Service Daily Progress Note Intern Pager: [phone]  Patient name: Debra Pierce Medical record number: [id number] Date of birth: 11-17-1935 Age: 82 y.o. Gender: female  Primary Care Provider: Angela Coxasanayaka, Gayani Y, MD Consultants: Neurology, Palliative  Code Status: Full Code   Pt Overview and Major Events to Date:  Progressive Parkinson's disease with suspected lewy body dementia   Assessment and Plan: Debra Pierce is an 82 year old femalewithParkinson's disease &suspected Lewy Body dementia treated forHCAPx7 days, s/p tube feeds  Dementia 2/2 Parkinson's Disease and suspected Lewy Body Dementia: Improved. [name] is awake and alert, continues to be conversant with more speech more comprehensible today. Speech is still not at her baseline but has improved from yesterday. Overnight, she is noted to have decreased PO intake as the day progressed, however was able to eat 50% of her breakfast this morning. Palliative spoke to family in regards to GOC and code status. Family has decided to maintain her full code status given her improvement and will revisit the decision with verbalized understanding of her disease prognosis. MRI revealing concern for new fracture at level L5 where kyphoplasty was performed  -consulted neurosurgery/spinal surgery, PA Costella to see today  -Palliative will continue to follow  -continue Sinemet1 tablet 4x daily  HCAP.Resolved: Patient continues to breathe well on RA. Pulm exam is CTAB.  -completedZosyn 7 daycourse -will repeat CXR on 05/15/18 for resolution/rule out malignancy  MildProtein calorie malnutrition: Patient continues to tolerate adequate PO intake. Patient appears to have decreased PO intake later in the day for dinner but overnight was able to tolerate entire Magic cup. -continue dysphagia 1 diet -Nutrition recs goal for patient to meet >90% of needs, will continue to follow   Osteoporosis: patient with recent  kyphoplasty and compression fracture. Patient entering EOL care and will not require starting any supplement or medications for this.  -will address any pain 2/2 past fractures and procedures with palliative pain management recommendations -Does not appear to be on therapy currently  Neuropathy: Son reports some neuropathy in RLE that patient takes Gabapentin 100 mg TID. Patient now more alert but does not appear to be uncomfortable regarding this problem. Will continue to hold unless deemed clinically necessary to restart.  -heldgabapentinfor altered mental status  -will continue PRN   ?JYN:WGNFAOZHTN:patient continues to benormotensiveo/n 118/78,now tolerating dysphagia diet. Unclear if patient previously treated with antihypertensives, not on any at her assisted living facility.   - will continue to monitor BP  HYQ:MVHQIONGEXHLD:Previously on Zocor 40mg   -stop zocor, given age and PD prognosis   Depression: Previously onhome Zoloft. -continue Zoloft   FEN/GI: dysphagia 1 diet PPx: SCDs  Disposition: pending SNF placement   Subjective:  [name] continues to conversant, tolerating 50% PO intake, following commands and oriented to self and situation. She denies pain or difficulty breathing. Patient's son, Debra Pierce at bedside this morning with no new concerns.    Objective: Temp:  [97.8 F (36.6 C)-98 F (36.7 C)] 98 F (36.7 C) (09/19 0758) Pulse Rate:  [86-96] 86 (09/19 0758) Resp:  [18] 18 (09/19 0500) BP: (109-136)/(68-82) 109/82 (09/19 0758) SpO2:  [95 %-97 %] 97 % (09/19 0758) Weight:  [60.5 kg] 60.5 kg (09/18 2140)  Physical Exam:  General: elderly female sitting up in bed in NAD, conversational   Cardiovascular: systolic murmur, audible s1 and s2, bilateral radial pulses palpable  Respiratory: CTAB, no wheezing, crackles, no increased work of breathing  Abdomen: soft, NT, ND, +BS throughout  Extremities: moves all,  non-edematous Neuro: alert and oriented, appropriately  responsive to questions, follows commands, 3/5 strength, increased strength from previously    Laboratory: No results for input(s): WBC, HGB, HCT, PLT in the last 168 hours. Recent Labs  Lab 04/14/18 0506 04/15/18 0649  NA 140 140  K 2.8* 4.0  CL 103 103  CO2 28 30  BUN 8 12  CREATININE 0.55 0.64  CALCIUM 8.5* 8.9  GLUCOSE 131* 148*   Imaging/Diagnostic Tests:   Mr [name]  Result Date: 04/19/2018 CLINICAL DATA:  Back pain and recent kyphoplasty. Parkinson's disease. EXAM: MRI LUMBAR SPINE WITHOUT CONTRAST TECHNIQUE: Multiplanar, multisequence MR imaging of the lumbar spine was performed. No intravenous contrast was administered. COMPARISON:  Lumbar spine MRI 04/10/2018 FINDINGS: Segmentation:  Normal Alignment:  Normal Vertebrae: Status post vertebral augmentation at L5, where there is a severe compression fracture with approximately 90% vertebral body height loss. There is increased T2-weighted signal within the bone marrow, extending to the posterior elements. The marrow signal of the lumbar spine is otherwise normal. Moderate chronic height loss of L4 without edema. Conus medullaris and cauda equina: Conus extends to the L1 level. Conus and cauda equina appear normal. Paraspinal and other soft tissues: Negative Disc levels: T10-T11: Imaged only in the sagittal plane.  Normal. T11-T12: Normal. T12-L1: Normal. L1-L2: Normal disc space. Mild facet hypertrophy. No spinal canal or neural foraminal stenosis. L2-L3: Mild disc bulge without spinal canal or neural foraminal stenosis. Normal facets. L3-L4: Large left eccentric disc bulge narrowing both lateral recesses, left worse than right. No central spinal canal stenosis. There is moderate left neural foraminal stenosis. L4-L5: Severe facet hypertrophy with large disc bulge. Moderate spinal canal stenosis. Severe right and moderate left neural foraminal stenosis. L5-S1: Moderate narrowing of the spinal canal at the L5 level due to  retropulsion. There is severe bilateral neural foraminal stenosis, right worse left. IMPRESSION: 1. Status post L5 vertebral augmentation without preoperative comparison. Severe height loss of L5 with mild retropulsion causing moderate spinal canal stenosis and severe bilateral neural foraminal stenosis. Moderate edema throughout, extending to the posterior elements. The findings may indicate a new fracture at the augmented level. Comparison to prior study, preferably the obtained before the vertebral augmentation, might be helpful. 2. L4-L5 severe right and moderate left neural foraminal stenosis with moderate spinal canal stenosis. 3. L3-L4 moderate left neural foraminal stenosis. 4. Chronic height loss of L4 without edema. Electronically Signed   By: Deatra Robinson M.D.   On: 04/19/2018 16:02     Marchia Meiers, Medical Student 04/20/2018, 12:04 PM Acting Intern pager: [phone]

## 2018-04-20 NOTE — Progress Notes (Signed)
  Speech Language Pathology Treatment: Dysphagia  Patient Details Name: Debra Pierce MRN: [id number] DOB: 04-30-1936 Today's Date: 04/20/2018 Time: [id number] SLP Time Calculation (min) (ACUTE ONLY): 17 min  Assessment / Plan / Recommendation Clinical Impression  Pt was awake but lethargic, requiring max verbal and tactile cues to participate in today's session focused on dysphagia. She exhibited decreased control and cohesion of bolus, anterior spillage, delayed and immediate cough following presentations of thin from staw. Given degree and frequency of anterior loss, presentation of thin from cup was not appropriate, however max verbal and tactile cues are required to prevent large, continous sips which increase risk for aspiration given pt's difficulty controling bolus. Given increased concern for aspiration today and pt's son/nurse tech report of frequent coughing with thin from straw. SLP recommends downgrade to nectar thick liquids and instrumental MBS to be completed this afternoon.    HPI HPI: 82 year old female admitted 04/10/18 with AMS, hallucinations, somnolence and difficulty communicating x1 week. PMH: Parkinson's disease, dementia, HTN, HLD, depression, neuropathy, GERD. CXR = RML atelectasis or PNA       SLP Plan  MBS       Recommendations  Diet recommendations: Dysphagia 1 (puree);Nectar-thick liquid Liquids provided via: Cup;Teaspoon;Straw Medication Administration: Crushed with puree Supervision: Full supervision/cueing for compensatory strategies;Staff to assist with self feeding Compensations: Slow rate;Small sips/bites;Multiple dry swallows after each bite/sip;Lingual sweep for clearance of pocketing;Minimize environmental distractions;Monitor for anterior loss Postural Changes and/or Swallow Maneuvers: Seated upright 90 degrees                Oral Care Recommendations: Oral care BID Follow up Recommendations: Skilled Nursing facility SLP Visit Diagnosis: Dysphagia,  oropharyngeal phase (R13.12) Plan: MBS       Suzzette RighterErin Imajean Mcdermid, Student SLP                 Suzzette RighterErin Delorese Sellin 04/20/2018, 12:28 PM

## 2018-04-20 NOTE — Progress Notes (Signed)
Orthopedic Tech Progress Note Patient Details:  Sunshyne PakistanJersey November 25, 1935 [id number]  Patient ID: Toniann PakistanJersey, female   DOB: November 25, 1935, 82 y.o.   MRN: [id number]   Nikki DomCrawford, Rion Catala 04/20/2018, 2:09 PM Called in bio-tech brace order; spoke with Wylene MenLacey

## 2018-04-20 NOTE — Progress Notes (Signed)
Discussed recent lumbar MRI findings with team during rounds with concern for new compression fracture at L5 and spinal stenosis. Called Neurosurgery provider on call and [facility]. PA Costella to see Ms. [place].   Nicki GuadalajaraMakiera Ruweyda Macknight, MS4  [phone]

## 2018-04-20 NOTE — Consult Note (Signed)
Chief Complaint   Chief Complaint  Patient presents with  . Altered Mental Status    HPI   HPI: Debra Pierce is a 82 y.o. female who was admitted approx 10 days ago due to AMS. History is limited due to patients mental status. Reportedly underwent kyphoplasty in [place] 6 weeks ago but family relocated her to [place] due to worsening mentation about 4 weeks ago. She was ambulatory prior to kyphoplasty, but now is wheelchair bound. She presented to ER due to worsening mental status thought secondary to worsening lewy body dementia and HCAP. Patient has been nonambulatory so an MRI was ordered revealing L4 and L5 compression fracture.  Neurosurgery was consulted because of compression fracture.  Patient currently denies any pain although question accuracy due to mental status. She does not follow commands. There is no family at bedside to discuss current situation with.  Patient Active Problem List   Diagnosis Date Noted  . Poor fluid intake   . Palliative care encounter   . Community acquired pneumonia   . HCAP (healthcare-associated pneumonia)   . Dementia due to Parkinson's disease without behavioral disturbance (HCC)   . Altered mental status 04/10/2018    PMH: Past Medical History:  Diagnosis Date  . Benign essential HTN   . Depression   . HLD (hyperlipidemia)   . Neuropathy   . Parkinson's disease dementia (HCC)     PSH: Past Surgical History:  Procedure Laterality Date  . L5 kyphoplasty Bilateral     Medications Prior to Admission  Medication Sig Dispense Refill Last Dose  . carbidopa-levodopa (SINEMET IR) 25-100 MG tablet Take 0.5 tablets by mouth 2 (two) times daily.   04/10/2018 at Unknown time  . [facility] MELATONIN 3 MG TABS Take 3 mg by mouth at bedtime.   0 04/09/2018 at Unknown time  . [facility] PAIN RELIEF 500 MG tablet Take 1,000 mg by mouth 3 (three) times daily as needed for pain.  0 04/10/2018 at Unknown time  . donepezil (ARICEPT) 10 MG tablet Take 10 mg by mouth at bedtime.   0 04/09/2018 at Unknown time  . famotidine (PEPCID) 20 MG tablet Take 20 mg by mouth daily.   04/10/2018 at Unknown time  . gabapentin (NEURONTIN) 100 MG capsule Take 100 mg by mouth 3 (three) times daily.  0 04/10/2018 at Unknown time  . loperamide (IMODIUM) 2 MG capsule Take 2 mg by mouth every 6 (six) hours as needed for diarrhea or loose stools.   unk at prni  . Menthol, Topical Analgesic, (BIOFREEZE) 4 % GEL Apply 1 application topically daily.   04/10/2018 at Unknown time  . Multiple Vitamins-Minerals (MULTIVITAMIN WITH MINERALS) tablet Take 1 tablet by mouth daily.   04/10/2018 at Unknown time  . sertraline (ZOLOFT) 25 MG tablet Take 25 mg by mouth at bedtime.   04/09/2018 at Unknown time  . simvastatin (ZOCOR) 40 MG tablet Take 40 mg by mouth at bedtime.   04/09/2018 at Unknown time    SH: Social History   Tobacco Use  . Smoking status: Never Smoker  . Smokeless tobacco: Never Used  Substance Use Topics  . Alcohol use: Not Currently  . Drug use: Never    MEDS: Prior to Admission medications   Medication Sig Start Date End Date Taking? Authorizing Provider  carbidopa-levodopa (SINEMET IR) 25-100 MG tablet Take 0.5 tablets by mouth 2 (two) times daily.   Yes [provider]  [facility] MELATONIN 3 MG TABS Take 3 mg by mouth at  bedtime.  02/27/18  Yes [provider]  [facility] PAIN RELIEF 500 MG tablet Take 1,000 mg by mouth 3 (three) times daily as needed for pain. 02/27/18  Yes [provider]  donepezil (ARICEPT) 10 MG tablet Take 10 mg by mouth at bedtime. 02/27/18  Yes [provider]  famotidine (PEPCID) 20 MG tablet Take 20 mg by mouth daily.   Yes [provider]  gabapentin (NEURONTIN) 100 MG capsule Take 100 mg by mouth 3 (three) times daily. 02/27/18  Yes [provider]  loperamide (IMODIUM) 2 MG capsule Take 2 mg by mouth every 6 (six) hours as needed for diarrhea or loose stools.   Yes [provider]  Menthol, Topical Analgesic,  (BIOFREEZE) 4 % GEL Apply 1 application topically daily.   Yes [provider]  Multiple Vitamins-Minerals (MULTIVITAMIN WITH MINERALS) tablet Take 1 tablet by mouth daily.   Yes [provider]  sertraline (ZOLOFT) 25 MG tablet Take 25 mg by mouth at bedtime.   Yes [provider]  simvastatin (ZOCOR) 40 MG tablet Take 40 mg by mouth at bedtime.   Yes [provider]    ALLERGY: Allergies  Allergen Reactions  . Codeine Other (See Comments)    Family stated unknown reaction  . Hydrocodone     Social History   Tobacco Use  . Smoking status: Never Smoker  . Smokeless tobacco: Never Used  Substance Use Topics  . Alcohol use: Not Currently     History reviewed. No pertinent family history.   ROS   ROS unable to obtain, AMS  Exam   Vitals:   04/20/18 0500 04/20/18 0758  BP: 118/78 109/82  Pulse: 96 86  Resp: 18   Temp: 98 F (36.7 C) 98 F (36.7 C)  SpO2: 95% 97%   Elderly female Resting comfortably Does not follow commands Intermittently answers questions but inappropriate response Flexes BLE to painful stimulus Sensation grossly intact to BLE  Results - Imaging/Labs   No results found for this or any previous visit (from the past 48 hour(s)).  Mr [name]  Result Date: 04/19/2018 CLINICAL DATA:  Back pain and recent kyphoplasty. Parkinson's disease. EXAM: MRI LUMBAR SPINE WITHOUT CONTRAST TECHNIQUE: Multiplanar, multisequence MR imaging of the lumbar spine was performed. No intravenous contrast was administered. COMPARISON:  Lumbar spine MRI 04/10/2018 FINDINGS: Segmentation:  Normal Alignment:  Normal Vertebrae: Status post vertebral augmentation at L5, where there is a severe compression fracture with approximately 90% vertebral body height loss. There is increased T2-weighted signal within the bone marrow, extending to the posterior elements. The marrow signal of the lumbar spine is otherwise normal. Moderate  chronic height loss of L4 without edema. Conus medullaris and cauda equina: Conus extends to the L1 level. Conus and cauda equina appear normal. Paraspinal and other soft tissues: Negative Disc levels: T10-T11: Imaged only in the sagittal plane.  Normal. T11-T12: Normal. T12-L1: Normal. L1-L2: Normal disc space. Mild facet hypertrophy. No spinal canal or neural foraminal stenosis. L2-L3: Mild disc bulge without spinal canal or neural foraminal stenosis. Normal facets. L3-L4: Large left eccentric disc bulge narrowing both lateral recesses, left worse than right. No central spinal canal stenosis. There is moderate left neural foraminal stenosis. L4-L5: Severe facet hypertrophy with large disc bulge. Moderate spinal canal stenosis. Severe right and moderate left neural foraminal stenosis. L5-S1: Moderate narrowing of the spinal canal at the L5 level due to retropulsion. There is severe bilateral neural foraminal stenosis, right worse left.  IMPRESSION: 1. Status post L5 vertebral augmentation without preoperative comparison. Severe height loss of L5 with mild retropulsion causing moderate spinal canal stenosis and severe bilateral neural foraminal stenosis. Moderate edema throughout, extending to the posterior elements. The findings may indicate a new fracture at the augmented level. Comparison to prior study, preferably the obtained before the vertebral augmentation, might be helpful. 2. L4-L5 severe right and moderate left neural foraminal stenosis with moderate spinal canal stenosis. 3. L3-L4 moderate left neural foraminal stenosis. 4. Chronic height loss of L4 without edema. Electronically Signed   By: Deatra Robinson M.D.   On: 04/19/2018 16:02   Impression/Plan   82 y.o. female with significant PMHx with recent admission due to AMS thought secondary to pneumonia and ?worsening dementia. By report, she is s/p lumbar kyphoplasty up in [place]. She has been nonambulatory since kyphoplasty .Family recently relocated her  [place]. I am unable to obtain an accurate neuro exam, but she does briskly w/d BLE to painful stimulus. Her MRI does show chronic L4 compression deformity and significant L5 compression fracture. There is retropulsion at L5 that does result in mod canal stenosis. Certainly back pain can cause the inability to want to ambulate, but I do not necessarily see a cause for an actual inability to ambulate. Rec LSO brace for sx relief. Continue treatment under current medical team for AMS.  No need for NS f/u.

## 2018-04-21 DIAGNOSIS — R404 Transient alteration of awareness: Secondary | ICD-10-CM

## 2018-04-21 DIAGNOSIS — R638 Other symptoms and signs concerning food and fluid intake: Secondary | ICD-10-CM

## 2018-04-21 MED ORDER — STARCH (THICKENING) PO POWD: ORAL | 0 refills | Status: DC

## 2018-04-21 MED ORDER — CARBIDOPA-LEVODOPA 25-100 MG PO TABS
1.0000 | ORAL_TABLET | Freq: Four times a day (QID) | ORAL | Status: DC
  Administered 2018-04-21 – 2018-04-24 (×11): 1 via ORAL
  Filled 2018-04-21 (×13): qty 1

## 2018-04-21 MED ORDER — CARBIDOPA-LEVODOPA 25-100 MG PO TABS: 1.0000 | ORAL_TABLET | Freq: Four times a day (QID) | ORAL | 0 refills | Status: DC

## 2018-04-21 MED ORDER — ENTACAPONE 200 MG PO TABS
200.0000 mg | ORAL_TABLET | Freq: Four times a day (QID) | ORAL | Status: DC
  Filled 2018-04-21: qty 1

## 2018-04-21 MED ORDER — ENTACAPONE 200 MG PO TABS
200.0000 mg | ORAL_TABLET | Freq: Four times a day (QID) | ORAL | Status: DC
  Administered 2018-04-21 – 2018-04-24 (×11): 200 mg via ORAL
  Filled 2018-04-21 (×13): qty 1

## 2018-04-21 MED ORDER — ENSURE ENLIVE PO LIQD: 237.0000 mL | Freq: Three times a day (TID) | ORAL | 12 refills | Status: AC

## 2018-04-21 MED ORDER — CARBIDOPA-LEVODOPA 25-100 MG PO TABS
1.0000 | ORAL_TABLET | Freq: Four times a day (QID) | ORAL | Status: DC
  Filled 2018-04-21: qty 1

## 2018-04-21 NOTE — Progress Notes (Signed)
Family Medicine Teaching Service Daily Progress Note Intern Pager: [phone]  Patient name: Debra Pierce Medical record number: [id number] Date of birth: 08/26/1935 Age: 82 y.o. Gender: female  Primary Care Provider: Angela Coxasanayaka, Gayani Y, MD Consultants: Neurology, Palliative  Code Status: Full Code   Pt Overview and Major Events to Date:  09/09: Admitted with HCAP and AMS 09/05-10-16: HCAP treated with zosyn 09/13: Neuro consulted for Parkinson's and adjusted sinemet dose 09/17: Palliative consulted 09/19: Neurosurgery consulted after lumbar MRI and rec LSO brace and no f/u   Assessment and Plan: Debra Pierce is an 82 year old femalewithParkinson's disease &suspected Lewy Body dementia treated forHCAPx7 days, s/p tube feeds  Dementia 2/2 Parkinson's Disease and suspected Lewy Body Dementia: Improved. Patient is awake and alert this morning.  She is still quite tired.  Patient has been able to eat some breakfast this morning and has been successful at taking her Sinemet.  Family reports that she had good intake at breakfast and lunch but they are unclear of her dinner.  However she at baseline does not eat a time for the son.  Overall patient is stable. -Palliative will continue to follow  -continue Sinemet1 tablet 4x daily -Monitor intake  L5 compression fracture s/p kyphoplasty: Neurosurgery consulted yesterday who recommended LSO brace for pain and outpatient follow-up on as-needed basis. -LSO brace -Neurosurgery has signed off  MildProtein calorie malnutrition:  Patient tolerating adequate p.o. intake. Patient appears to have decreased PO intake later in the day for dinner.  She enjoys Magic cup. -continue dysphagia 1 diet -Nutrition recs goal for patient to meet >90% of needs, will continue to follow   Osteoporosis: patient with recent kyphoplasty and compression fracture. Patient entering EOL care and will not require starting any supplement or medications for this.  -will  address any pain 2/2 past fractures and procedures with palliative pain management recommendations -Consider starting vitamin D/calcium and bisphosphonate therapy  Neuropathy: Son reports some neuropathy in RLE that patient takes Gabapentin 100 mg TID. Patient now more alert but does not appear to be uncomfortable regarding this problem. Will continue to hold unless deemed clinically necessary to restart.  -heldgabapentinfor altered mental status  -will continue PRN   Depression: Previously onhome Zoloft. -continue Zoloft   FEN/GI: dysphagia 1 diet PPx: SCDs  Disposition: stable for discharge to SNF  Subjective:  Patient sitting up in bed this morning and had success at eating some of her breakfast per her son.  Patient was able to take her medications as well.  Patient reported that she has no pain or shortness of breath.  Objective: Temp:  [97.5 F (36.4 C)-98.2 F (36.8 C)] 97.5 F (36.4 C) (09/19 2127) Pulse Rate:  [88-93] 92 (09/20 0633) Resp:  [18-22] 22 (09/20 02720633) BP: (110-113)/(59-86) 110/59 (09/20 53660633) SpO2:  [93 %-98 %] 94 % (09/20 44030633)  Physical Exam: General: NAD, pleasant, chronically ill appearing female with masked face Cardiovascular: RRR, no m/r/g, no LE edema Respiratory: CTA BL, normal work of breathing Derm: no rashes appreciated Neuro: CN II-XII grossly intact Psych: AO to self only  Laboratory: No results for input(s): WBC, HGB, HCT, PLT in the last 168 hours. Recent Labs  Lab 04/15/18 0649  NA 140  K 4.0  CL 103  CO2 30  BUN 12  CREATININE 0.64  CALCIUM 8.9  GLUCOSE 148*   Imaging/Diagnostic Tests:   Dg Swallowing Func-speech Pathology  Result Date: 04/20/2018 Objective Swallowing Evaluation: Type of Study: MBS-Modified Barium Swallow Study  Patient Details  Name: Debra Pierce MRN: [id number] Date of Birth: 12-03-1935 Today's Date: 04/20/2018 Time: SLP Start Time (ACUTE ONLY): 0129 -SLP Stop Time (ACUTE ONLY): 0141 SLP Time Calculation  (min) (ACUTE ONLY): 12 min Past Medical History: Past Medical History: Diagnosis Date . Benign essential HTN  . Depression  . HLD (hyperlipidemia)  . Neuropathy  . Parkinson's disease dementia [facility] [place])  Past Surgical History: Past Surgical History: Procedure Laterality Date . L5 kyphoplasty Bilateral  HPI: 82 year old female admitted 04/10/18 with AMS, hallucinations, somnolence and difficulty communicating x1 week. PMH: Parkinson's disease, dementia, HTN, HLD, depression, neuropathy, GERD. CXR = RML atelectasis or PNA  Subjective: Pt resting in bed. No family present. Pt slightly more alert today. PT assisted ST to reposition pt to an upright position. Assessment / Plan / Recommendation CHL IP CLINICAL IMPRESSIONS 04/20/2018 Clinical Impression Pt presented with moderate oropharyngeal dysphagia, characterized by anterior loss, decreased control, cohesion and formation of bolus, penetration and silent penetration. Given pt's cogntive impairments secondary to dx dementia, she required max verbal cues to accept nectar, thin, and puree PO's (cues not to phonate during or after swallow). Anterior loss, lingual residue and decreased formation/cohesion noted across textures. Oral holding also observed with puree. Instances of silent penetration on anterior laryngeal vestibule with thin liquids were the result of decreased timing and coordination of protective mechanisms. Given airway intrusion with absence of sensation with thin and risk recommend continue nectar thick liquids, puree texture, crushed meds, upright position, no distractions, check oral cavity and full supervision/assist.  SLP Visit Diagnosis Dysphagia, oropharyngeal phase (R13.12) Attention and concentration deficit following -- Frontal lobe and executive function deficit following -- Impact on safety and function Moderate aspiration risk   CHL IP TREATMENT RECOMMENDATION 04/20/2018 Treatment Recommendations Therapy as outlined in treatment plan below   Prognosis  04/20/2018 Prognosis for Safe Diet Advancement Fair Barriers to Reach Goals Cognitive deficits;Severity of deficits Barriers/Prognosis Comment -- CHL IP DIET RECOMMENDATION 04/20/2018 SLP Diet Recommendations Dysphagia 1 (Puree) solids;Nectar thick liquid Liquid Administration via Cup;Straw Medication Administration Crushed with puree Compensations Minimize environmental distractions;Lingual sweep for clearance of pocketing;Small sips/bites;Slow rate;Monitor for anterior loss;Multiple dry swallows after each bite/sip Postural Changes Seated upright at 90 degrees   CHL IP OTHER RECOMMENDATIONS 04/20/2018 Recommended Consults -- Oral Care Recommendations Oral care BID Other Recommendations --   CHL IP FOLLOW UP RECOMMENDATIONS 04/20/2018 Follow up Recommendations Skilled Nursing facility   [facility] IP FREQUENCY AND DURATION 04/20/2018 Speech Therapy Frequency (ACUTE ONLY) min 2x/week Treatment Duration 2 weeks      CHL IP ORAL PHASE 04/20/2018 Oral Phase Impaired Oral - Pudding Teaspoon NT Oral - Pudding Cup NT Oral - Honey Teaspoon -- Oral - Honey Cup -- Oral - Nectar Teaspoon -- Oral - Nectar Cup -- Oral - Nectar Straw Lingual/palatal residue;Right anterior bolus loss;Left anterior bolus loss Oral - Thin Teaspoon NT Oral - Thin Cup NT Oral - Thin Straw Left anterior bolus loss;Right anterior bolus loss;Lingual/palatal residue;Decreased bolus cohesion Oral - Puree -- Oral - Mech Soft -- Oral - Regular -- Oral - Multi-Consistency -- Oral - Pill -- Oral Phase - Comment --  CHL IP PHARYNGEAL PHASE 04/20/2018 Pharyngeal Phase Impaired Pharyngeal- Pudding Teaspoon NT Pharyngeal -- Pharyngeal- Pudding Cup NT Pharyngeal -- Pharyngeal- Honey Teaspoon -- Pharyngeal -- Pharyngeal- Honey Cup -- Pharyngeal -- Pharyngeal- Nectar Teaspoon -- Pharyngeal -- Pharyngeal- Nectar Cup -- Pharyngeal -- Pharyngeal- Nectar Straw -- Pharyngeal -- Pharyngeal- Thin Teaspoon NT Pharyngeal -- Pharyngeal- Thin Cup NT Pharyngeal -- Pharyngeal- Thin  Straw  Penetration/Aspiration during swallow Pharyngeal Material enters airway, remains ABOVE vocal cords and not ejected out Pharyngeal- Puree -- Pharyngeal -- Pharyngeal- Mechanical Soft -- Pharyngeal -- Pharyngeal- Regular -- Pharyngeal -- Pharyngeal- Multi-consistency -- Pharyngeal -- Pharyngeal- Pill -- Pharyngeal -- Pharyngeal Comment --  CHL IP CERVICAL ESOPHAGEAL PHASE 04/20/2018 Cervical Esophageal Phase WFL Pudding Teaspoon -- Pudding Cup -- Honey Teaspoon -- Honey Cup -- Nectar Teaspoon -- Nectar Cup -- Nectar Straw -- Thin Teaspoon -- Thin Cup -- Thin Straw -- Puree -- Mechanical Soft -- Regular -- Multi-consistency -- Pill -- Cervical Esophageal Comment -- Royce Macadamia 04/20/2018, 3:23 PM Breck Coons [employer] administrator Pager [phone] Office [phone]               Dymond Gutt, [place], DO 04/21/2018, 8:46 AM

## 2018-04-21 NOTE — Clinical Social Work Note (Addendum)
CSW working with patient's son Brian [place] on placement for ST rehab. CSW initially informed that patient ready for discharge today. [facility] and contact made with [facility] Haz[facility], admissions director, and they can take patient today. CSW received PASRR number today - [id number] [id number], effective 9/20 - 05/21/18.   Call received from Dr. Constance Goltzlson at 1:55 pm advising CSW that patient will not discharge today as they need to monitor her meds. Tresa EndoKelly with [facility] contacted and updated, son advised and [facility] transport cancelled. Handfoff will be completed for weekend CSW.  Genelle BalVanessa Harout Scheurich, MSW, LCSW Licensed Clinical Social Worker Clinical Social Work Department [facility] [phone]

## 2018-04-21 NOTE — Discharge Instructions (Signed)
It has been a pleasure taking care of you! You were admitted due to pneumonia. We have treated you with antibiotics. With that your symptoms improved to the point we think it is safe to discharge you from the hospital. Your dose of sinemet was also increased during your hospital stay, it is important to follow up with a neurologist as an outpatient. The address, date and time are found on the discharge paper under follow up section.  Given all your medical problems, we had a family meeting during your hospital stay. It is important to discuss your wishes regarding medical decisions with your loved ones. Palliative medicine is a specialty that helps with this process for people with serious medical conditions, they may be helpful for you.  There could be some changes made to your home medications during this hospitalization. Please, make sure to read the directions before you take them. The names and directions on how to take these medications are found on this discharge paper under medication section.

## 2018-04-21 NOTE — Progress Notes (Addendum)
Patient medically stable for discharge to SNF. We can adjust medications as an outpatient.   [place] Asra Gambrel, DO PGY-2, [facility]   **Addendum**  After further discussion with full interdisciplinary team including geriatrician, Dr. McDiarmid,  and attending, Dr. Manson PasseyBrown, evaluation of patient this morning shows that she will need further medication adjustments that should be done as an inpatient.  We are currently working with our pharmacist Dr. Raymondo BandKoval on proper medication changes.  Likelihood of patient bouncing back if she were to leave prior to these medications adjustments is greatly increased.  Patient is not safe to go home with current episodes of freezing up given her inadequate dosing of Sinemet for her Parkinson's disease.    Attempted to discuss this with family and was unable to talk with them in the hospital but will continue to try to update family.  [place] Maeve Debord, DO PGY-2, [facility]

## 2018-04-21 NOTE — Progress Notes (Signed)
Occupational Therapy Treatment Patient Details Name: Debra Pierce MRN: [id number] DOB: Jun 04, 1936 Today's Date: 04/21/2018    History of present illness Debra Pierce is a 82 y.o. female presenting with altered mental status. CAP; walked unassisted prior to vertebral compression fx and kyphoplasty recently; undergoing rehab; PMH is significant for Parkinson's Disease with dementia, HTN, HLD, Depression, neuropathy, and GERD.   OT comments  Pt with slow progress towards OT goals. She was able to follow commands approx 25% of time during session given repetition and multimodal cues. Pt requiring max hand over hand assist for completion of simple grooming ADLs including initiating and carrying out task components. Feel SNF recommendation remains appropriate at this time. Will continue to follow acutely to progress pt towards established OT goals.   Follow Up Recommendations  SNF;Supervision/Assistance - 24 hour    Equipment Recommendations  Other (comment)(TBD in next venue )          Precautions / Restrictions Precautions Precautions: Back;Fall Precaution Booklet Issued: No Precaution Comments: recent kyphoplasty Restrictions Weight Bearing Restrictions: No                                                     ADL either performed or assessed with clinical judgement   ADL Overall ADL's : Needs assistance/impaired     Grooming: Wash/dry hands;Wash/dry face;Sitting;Maximal assistance Grooming Details (indicate cue type and reason): max A hand over hand initiation and task continuation                               General ADL Comments: pt following commands approx 25% of the time; requires max hand over hand assist for basic ADLs, continues to require totalA for further ADL completion      Vision   Additional Comments: pt intermittently making eye contact with therapist during session    Perception     Praxis      Cognition Arousal/Alertness:  Awake/alert Behavior During Therapy: Flat affect Overall Cognitive Status: Impaired/Different from baseline Area of Impairment: Attention;Memory;Following commands;Safety/judgement;Awareness;Problem solving                   Current Attention Level: Focused Memory: Decreased short-term memory Following Commands: Follows one step commands inconsistently;Follows one step commands with increased time Safety/Judgement: Decreased awareness of safety;Decreased awareness of deficits Awareness: Intellectual Problem Solving: Slow processing;Decreased initiation;Requires verbal cues;Requires tactile cues;Difficulty sequencing General Comments: pt following simple one step commands approx 25% of the time given max hand over hand assist and multimodal cues         Exercises     Shoulder Instructions       General Comments      Pertinent Vitals/ Pain       Pain Assessment: Faces Faces Pain Scale: No hurt Pain Intervention(s): Monitored during session  Home Living                                                        Frequency           Progress Toward Goals  OT Goals(current goals can now be found in the care plan  section)  Progress towards OT goals: OT to reassess next treatment  Acute Rehab OT Goals Patient Stated Goal: Unable to state OT Goal Formulation: Patient unable to participate in goal setting Time For Goal Achievement: 04/25/18 Potential to Achieve Goals: Fair  Plan Discharge plan remains appropriate    Co-evaluation                 AM-PAC PT "6 Clicks" Daily Activity     Outcome Measure   Help from another person eating meals?: A Lot Help from another person taking care of personal grooming?: A Lot Help from another person toileting, which includes using toliet, bedpan, or urinal?: Total Help from another person bathing (including washing, rinsing, drying)?: Total Help from another person to put on and taking off  regular upper body clothing?: Total Help from another person to put on and taking off regular lower body clothing?: Total 6 Click Score: 8    End of Session    OT Visit Diagnosis: Unsteadiness on feet (R26.81);Other abnormalities of gait and mobility (R26.89);Muscle weakness (generalized) (M62.81);History of falling (Z91.81);Other symptoms and signs involving cognitive function;Other symptoms and signs involving the nervous system (R29.898)   Activity Tolerance Patient tolerated treatment well   Patient Left in bed;with call bell/phone within reach;with bed alarm set   Nurse Communication Mobility status        Time: 1610-9604 OT Time Calculation (min): 14 min  Charges: OT General Charges $OT Visit: 1 Visit OT Treatments $Self Care/Home Management : 8-22 mins  Marcy Siren, OT Supplemental Rehabilitation Services Pager [phone] Office [phone]     LOVETA DELLIS 04/21/2018, 2:23 PM

## 2018-04-22 DIAGNOSIS — R339 Retention of urine, unspecified: Secondary | ICD-10-CM

## 2018-04-22 NOTE — Progress Notes (Signed)
Family Medicine Teaching Service Daily Progress Note Intern Pager: [phone]  Patient name: Debra Pierce Medical record number: [id number] Date of birth: 1935/12/29 Age: 82 y.o. Gender: female  Primary Care Provider: Angela Coxasanayaka, Gayani Y, MD Consultants: Neurology, Palliative  Code Status: Full Code   Pt Overview and Major Events to Date:  09/09: Admitted with HCAP and AMS 09/05-10-16: HCAP treated with zosyn 09/13: Neuro consulted for Parkinson's and adjusted sinemet dose 09/17: Palliative consulted 09/19: Neurosurgery consulted after lumbar MRI and rec LSO brace and no f/u   Assessment and Plan: Debra Pierce is an 82 year old femalewithParkinson's disease &suspected Lewy Body dementia treated forHCAPx7 days, s/p tube feeds  Dementia 2/2 Parkinson's Disease and suspected Lewy Body Dementia: Patient was kept yesterday due to concerns regarding off periods that may interfere with ADLs. Entacapone was added as an adjunct and the timing of her sinemet was adjust to optimize therapeutic effect and hopefully prevent a bounce back hospital admission. This morning she has less rigidity although still with masked facies.   -Palliative on board, plans to follow as outpatient  -continue Sinemet25-100mg  1 tablet 4x daily with the added entacapone 200mg  with each sinemet administration -outpatient neurology follow up scheduled for 05/11/18 with [facility]  Urinary retention, new and acute. Patient had not urinated throughout the day yesterday 04/21/18, bladder scan revealed 530cc requiring I&O cath with 600cc UOP. Likely neurogenic bladder given spinal stenosis. - voiding trial today, will monitor UOP closely and have q6 bladder scans - intermittent I&O cath if bladder scan showing >700cc, avoid foley  L5 compression fracture s/p kyphoplasty. Unfortunately repeat L spine MRI on 04/19/18 showed a possible new L5 fracture with moderate spinal canal stenosis and severe bilateral neural foraminal  stenosis. Unable to compare with preaugmentation study.  - Neurosurgery curbsided, recommended LSO brace for pain and outpatient follow-up on as-needed basis. - reconsult PT to work with mobility  MildProtein calorie malnutrition:  Eating 50-100% of meals. -continue dysphagia 1 diet -Monitor po intake -Nutrition recs goal for patient to meet >90% of needs, will continue to follow   Osteoporosis: patient with recent kyphoplasty and compression fracture however has progressive Parkinson and lewy body dementia so likely would not see benefit from bisphosphonates.  -monitor for pain  Neuropathy, stable. No complaints of this at this time -holding home gabapentinwhile titrating dementia medications  Depression: Previously onhome Zoloft. -continue Zoloft as outpatient  FEN/GI: dysphagia 1 diet PPx: lovenox  Disposition: SNF  Subjective:  Per nursing patient had urinary retention overnight and has not yet voided this morning. No family at bedside this morning. Patient continued to vocalize mumblings.  Objective: Temp:  [97.3 F (36.3 C)-98.6 F (37 C)] 97.3 F (36.3 C) (09/21 0749) Pulse Rate:  [72-102] 72 (09/21 0749) Resp:  [17-18] 17 (09/21 0749) BP: (123-145)/(82-106) 123/106 (09/21 0749) SpO2:  [95 %-98 %] 98 % (09/21 0749)  Physical Exam: General: sitting up in bed, eating breakfast handfed by NT Cardiovascular: RRR, no murmurs Respiratory: CTAB, normal work of breathing on room air Abd: soft, nontender, nondistended, + bowel sounds  Derm: warm and dry Neuro: alert and awake. Masked facies. Moving both upper extremities equally with mild rigidity. Follows simple commands  Laboratory: CBGs 127-132  Imaging/Diagnostic Tests: Mr [name]  Result Date: 04/19/2018 CLINICAL DATA:  Back pain and recent kyphoplasty. Parkinson's disease. EXAM: MRI LUMBAR SPINE WITHOUT CONTRAST TECHNIQUE: Multiplanar, multisequence MR imaging of the lumbar spine was  performed. No intravenous contrast was administered. COMPARISON:  Lumbar  spine MRI 04/10/2018 FINDINGS: Segmentation:  Normal Alignment:  Normal Vertebrae: Status post vertebral augmentation at L5, where there is a severe compression fracture with approximately 90% vertebral body height loss. There is increased T2-weighted signal within the bone marrow, extending to the posterior elements. The marrow signal of the lumbar spine is otherwise normal. Moderate chronic height loss of L4 without edema. Conus medullaris and cauda equina: Conus extends to the L1 level. Conus and cauda equina appear normal. Paraspinal and other soft tissues: Negative Disc levels: T10-T11: Imaged only in the sagittal plane.  Normal. T11-T12: Normal. T12-L1: Normal. L1-L2: Normal disc space. Mild facet hypertrophy. No spinal canal or neural foraminal stenosis. L2-L3: Mild disc bulge without spinal canal or neural foraminal stenosis. Normal facets. L3-L4: Large left eccentric disc bulge narrowing both lateral recesses, left worse than right. No central spinal canal stenosis. There is moderate left neural foraminal stenosis. L4-L5: Severe facet hypertrophy with large disc bulge. Moderate spinal canal stenosis. Severe right and moderate left neural foraminal stenosis. L5-S1: Moderate narrowing of the spinal canal at the L5 level due to retropulsion. There is severe bilateral neural foraminal stenosis, right worse left. IMPRESSION: 1. Status post L5 vertebral augmentation without preoperative comparison. Severe height loss of L5 with mild retropulsion causing moderate spinal canal stenosis and severe bilateral neural foraminal stenosis. Moderate edema throughout, extending to the posterior elements. The findings may indicate a new fracture at the augmented level. Comparison to prior study, preferably the obtained before the vertebral augmentation, might be helpful. 2. L4-L5 severe right and moderate left neural foraminal stenosis with moderate  spinal canal stenosis. 3. L3-L4 moderate left neural foraminal stenosis. 4. Chronic height loss of L4 without edema. Electronically Signed   By: Deatra Robinson M.D.   On: 04/19/2018 16:02   Dg Swallowing Func-speech Pathology  Result Date: 04/20/2018 Objective Swallowing Evaluation: Type of Study: MBS-Modified Barium Swallow Study  Patient Details Name: Andreana Pierce MRN: [id number] Date of Birth: 1935/08/16 Today's Date: 04/20/2018 Time: SLP Start Time (ACUTE ONLY): 0129 -SLP Stop Time (ACUTE ONLY): 0141 SLP Time Calculation (min) (ACUTE ONLY): 12 min Past Medical History: Past Medical History: Diagnosis Date . Benign essential HTN  . Depression  . HLD (hyperlipidemia)  . Neuropathy  . Parkinson's disease dementia [facility])  Past Surgical History: Past Surgical History: Procedure Laterality Date . L5 kyphoplasty Bilateral  HPI: 82 year old female admitted 04/10/18 with AMS, hallucinations, somnolence and difficulty communicating x1 week. PMH: Parkinson's disease, dementia, HTN, HLD, depression, neuropathy, GERD. CXR = RML atelectasis or PNA  Subjective: Pt resting in bed. No family present. Pt slightly more alert today. PT assisted ST to reposition pt to an upright position. Assessment / Plan / Recommendation CHL IP CLINICAL IMPRESSIONS 04/20/2018 Clinical Impression Pt presented with moderate oropharyngeal dysphagia, characterized by anterior loss, decreased control, cohesion and formation of bolus, penetration and silent penetration. Given pt's cogntive impairments secondary to dx dementia, she required max verbal cues to accept nectar, thin, and puree PO's (cues not to phonate during or after swallow). Anterior loss, lingual residue and decreased formation/cohesion noted across textures. Oral holding also observed with puree. Instances of silent penetration on anterior laryngeal vestibule with thin liquids were the result of decreased timing and coordination of protective mechanisms. Given airway intrusion with absence of  sensation with thin and risk recommend continue nectar thick liquids, puree texture, crushed meds, upright position, no distractions, check oral cavity and full supervision/assist.  SLP Visit Diagnosis Dysphagia, oropharyngeal phase (R13.12) Attention and concentration  deficit following -- Frontal lobe and executive function deficit following -- Impact on safety and function Moderate aspiration risk   CHL IP TREATMENT RECOMMENDATION 04/20/2018 Treatment Recommendations Therapy as outlined in treatment plan below   Prognosis 04/20/2018 Prognosis for Safe Diet Advancement Fair Barriers to Reach Goals Cognitive deficits;Severity of deficits Barriers/Prognosis Comment -- CHL IP DIET RECOMMENDATION 04/20/2018 SLP Diet Recommendations Dysphagia 1 (Puree) solids;Nectar thick liquid Liquid Administration via Cup;Straw Medication Administration Crushed with puree Compensations Minimize environmental distractions;Lingual sweep for clearance of pocketing;Small sips/bites;Slow rate;Monitor for anterior loss;Multiple dry swallows after each bite/sip Postural Changes Seated upright at 90 degrees   CHL IP OTHER RECOMMENDATIONS 04/20/2018 Recommended Consults -- Oral Care Recommendations Oral care BID Other Recommendations --   CHL IP FOLLOW UP RECOMMENDATIONS 04/20/2018 Follow up Recommendations Skilled Nursing facility   [facility] IP FREQUENCY AND DURATION 04/20/2018 Speech Therapy Frequency (ACUTE ONLY) min 2x/week Treatment Duration 2 weeks      CHL IP ORAL PHASE 04/20/2018 Oral Phase Impaired Oral - Pudding Teaspoon NT Oral - Pudding Cup NT Oral - Honey Teaspoon -- Oral - Honey Cup -- Oral - Nectar Teaspoon -- Oral - Nectar Cup -- Oral - Nectar Straw Lingual/palatal residue;Right anterior bolus loss;Left anterior bolus loss Oral - Thin Teaspoon NT Oral - Thin Cup NT Oral - Thin Straw Left anterior bolus loss;Right anterior bolus loss;Lingual/palatal residue;Decreased bolus cohesion Oral - Puree -- Oral - Mech Soft -- Oral - Regular -- Oral  - Multi-Consistency -- Oral - Pill -- Oral Phase - Comment --  CHL IP PHARYNGEAL PHASE 04/20/2018 Pharyngeal Phase Impaired Pharyngeal- Pudding Teaspoon NT Pharyngeal -- Pharyngeal- Pudding Cup NT Pharyngeal -- Pharyngeal- Honey Teaspoon -- Pharyngeal -- Pharyngeal- Honey Cup -- Pharyngeal -- Pharyngeal- Nectar Teaspoon -- Pharyngeal -- Pharyngeal- Nectar Cup -- Pharyngeal -- Pharyngeal- Nectar Straw -- Pharyngeal -- Pharyngeal- Thin Teaspoon NT Pharyngeal -- Pharyngeal- Thin Cup NT Pharyngeal -- Pharyngeal- Thin Straw Penetration/Aspiration during swallow Pharyngeal Material enters airway, remains ABOVE vocal cords and not ejected out Pharyngeal- Puree -- Pharyngeal -- Pharyngeal- Mechanical Soft -- Pharyngeal -- Pharyngeal- Regular -- Pharyngeal -- Pharyngeal- Multi-consistency -- Pharyngeal -- Pharyngeal- Pill -- Pharyngeal -- Pharyngeal Comment --  CHL IP CERVICAL ESOPHAGEAL PHASE 04/20/2018 Cervical Esophageal Phase WFL Pudding Teaspoon -- Pudding Cup -- Honey Teaspoon -- Honey Cup -- Nectar Teaspoon -- Nectar Cup -- Nectar Straw -- Thin Teaspoon -- Thin Cup -- Thin Straw -- Puree -- Mechanical Soft -- Regular -- Multi-consistency -- Pill -- Cervical Esophageal Comment -- Royce Macadamia 04/20/2018, 3:23 PM Breck Coons Litaker M.Ed Sports administrator Pager [phone] Office [phone]               Leland Her, DO PGY-3, [facility] 04/22/2018 8:16 AM

## 2018-04-22 NOTE — Progress Notes (Signed)
Per report from previous RN, patient had not urinated since early Friday AM.  Patient is incontinent of urine normally.  Bladder scan done and showed greater than 530 in the bladder.  MD made aware.  Order received for I & O catheter.  Patient tolerated procedure.  600 cc urine obtained.  Will continue to monitor patient.  Bernie CoveyKimberly Jmari Pelc RN-BC, [employer]

## 2018-04-22 NOTE — Progress Notes (Signed)
Patient has not voided yet and it is 1544h. MD made aware. Waiting on orders at this time. Will continue to monitor.   Lillia PaulsLaura B. RN

## 2018-04-23 DIAGNOSIS — R151 Fecal smearing: Secondary | ICD-10-CM

## 2018-04-23 LAB — BASIC METABOLIC PANEL
ANION GAP: 13 (ref 5–15)
BUN: 25 mg/dL — ABNORMAL HIGH (ref 8–23)
CO2: 22 mmol/L (ref 22–32)
Calcium: 9.2 mg/dL (ref 8.9–10.3)
Chloride: 110 mmol/L (ref 98–111)
Creatinine, Ser: 0.78 mg/dL (ref 0.44–1.00)
GFR calc non Af Amer: >60 mL/min (ref >60)
GLUCOSE: 106 mg/dL — AB (ref 70–99)
POTASSIUM: 3.9 mmol/L (ref 3.5–5.1)
Sodium: 145 mmol/L (ref 135–145)

## 2018-04-23 NOTE — Progress Notes (Signed)
Physical Therapy Treatment Patient Details Name: Debra Pierce MRN: [id number] DOB: 1935-10-16 Today's Date: 04/23/2018    History of Present Illness Debra Pierce is a 82 y.o. female presenting with altered mental status. CAP; walked unassisted prior to vertebral compression fx and kyphoplasty recently; undergoing rehab; PMH is significant for Parkinson's Disease with dementia, HTN, HLD, Depression, neuropathy, and GERD.    PT Comments    PT reordered by MD, family present and delighted we've come to work with her. Patient began session lethargic with R lean noted during sitting, however as session continued more alert and able to correct posture and sit without physical assistance. Attempted sit to stands x3 pt eager to participate, unable to come fully to standing max A x2 for bed mobility.     Follow Up Recommendations  SNF     Equipment Recommendations  Other (comment)    Recommendations for Other Services Speech consult     Precautions / Restrictions Precautions Precautions: Back;Fall Precaution Booklet Issued: No Precaution Comments: recent kyphoplasty Restrictions Weight Bearing Restrictions: No    Mobility  Bed Mobility Overal bed mobility: Needs Assistance Bed Mobility: Sit to Supine;Rolling;Sidelying to Sit Rolling: Max assist;+2 for physical assistance Sidelying to sit: Max assist;+2 for physical assistance Supine to sit: Max assist;+2 for physical assistance        Transfers Overall transfer level: Needs assistance Equipment used: 2 person hand held assist Transfers: Sit to/from Stand Sit to Stand: +2 physical assistance;Max assist Stand pivot transfers: Max assist;+2 physical assistance       General transfer comment: could not sucessfully come to standing, x3 trials of standing with max A x2 and bed pad for support, blocking knees. pt with half squat at best, family happy she was trying and partcipiating.   Ambulation/Gait                  Stairs             Wheelchair Mobility    Modified Rankin (Stroke Patients Only)       Balance Overall balance assessment: Needs assistance;History of Falls Sitting-balance support: Feet supported Sitting balance-Leahy Scale: Poor     Standing balance support: Bilateral upper extremity supported;During functional activity Standing balance-Leahy Scale: Zero                              Cognition Arousal/Alertness: Awake/alert Behavior During Therapy: Flat affect Overall Cognitive Status: Impaired/Different from baseline Area of Impairment: Attention;Memory;Following commands;Safety/judgement;Awareness;Problem solving                 Orientation Level: Disoriented to;Place;Time;Situation Current Attention Level: Focused Memory: Decreased short-term memory Following Commands: Follows one step commands inconsistently;Follows one step commands with increased time Safety/Judgement: Decreased awareness of safety;Decreased awareness of deficits Awareness: Intellectual Problem Solving: Slow processing;Decreased initiation;Requires verbal cues;Requires tactile cues;Difficulty sequencing General Comments: pt following simple one step commands approx 25% of the time given max hand over hand assist and multimodal cues       Exercises General Exercises - Lower Extremity Long Arc Quad: 10 reps Heel Slides: 10 reps Hip ABduction/ADduction: 10 reps Straight Leg Raises: 10 reps    General Comments        Pertinent Vitals/Pain Pain Assessment: No/denies pain    Home Living                      Prior Function  PT Goals (current goals can now be found in the care plan section) Acute Rehab PT Goals Patient Stated Goal: Unable to state PT Goal Formulation: Patient unable to participate in goal setting Time For Goal Achievement: 04/30/18 Potential to Achieve Goals: Fair Progress towards PT goals: Progressing toward goals     Frequency    Min 2X/week      PT Plan Current plan remains appropriate;Discharge plan needs to be updated    Co-evaluation              AM-PAC PT "6 Clicks" Daily Activity  Outcome Measure  Difficulty turning over in bed (including adjusting bedclothes, sheets and blankets)?: Unable Difficulty moving from lying on back to sitting on the side of the bed? : Unable Difficulty sitting down on and standing up from a chair with arms (e.g., wheelchair, bedside commode, etc,.)?: Unable Help needed moving to and from a bed to chair (including a wheelchair)?: Total Help needed walking in hospital room?: Total Help needed climbing 3-5 steps with a railing? : Total 6 Click Score: 6    End of Session Equipment Utilized During Treatment: Gait belt Activity Tolerance: Patient limited by fatigue Patient left: in bed;with bed alarm set Nurse Communication: Mobility status PT Visit Diagnosis: Other abnormalities of gait and mobility (R26.89);Muscle weakness (generalized) (M62.81);Difficulty in walking, not elsewhere classified (R26.2)     Time: 1610-9604 PT Time Calculation (min) (ACUTE ONLY): 31 min  Charges:  $Therapeutic Exercise: 8-22 mins $Therapeutic Activity: 8-22 mins                    Etta Grandchild, PT, DPT Acute Rehabilitation Services Pager: [phone] Office: [phone]     Etta Grandchild 04/23/2018, 5:39 PM

## 2018-04-23 NOTE — Progress Notes (Signed)
Son at bedside feeding patient magic ice cream. Patient tolerating feeding well. Will continue to monitor patient.   Lillia PaulsLaura B. RN

## 2018-04-23 NOTE — Progress Notes (Signed)
Bladder scan preformed at this time. 275 mL was the result of bladder scan. Will continue to monitor patient for incontinence episodes or urine or stool.   Lillia PaulsLaura B. RN

## 2018-04-23 NOTE — Progress Notes (Signed)
Patient's flexiseal has been taken out. MD made aware. Will continue to monitor.   Lillia PaulsLaura B. RN

## 2018-04-23 NOTE — Progress Notes (Signed)
Family Medicine Teaching Service Daily Progress Note Intern Pager: [phone]  Patient name: Debra Pierce Medical record number: [id number] Date of birth: [birth year] Age: 82 y.o. Gender: female  Primary Care Provider: Angela Coxasanayaka, Gayani Y, MD Consultants: Neurology, Palliative  Code Status: Full Code   Pt Overview and Major Events to Date:  09/09: Admitted with HCAP and AMS 09/05-10-16: HCAP treated with zosyn 09/13: Neuro consulted for Parkinson's and adjusted sinemet dose 09/17: Palliative consulted 09/19: Neurosurgery consulted after lumbar MRI and rec LSO brace and no f/u   Assessment and Plan: Debra Pierce is an 82 year old femalewithParkinson's disease &suspected Lewy Body dementia treated forHCAPx7 days, s/p tube feeds  Dementia 2/2 Parkinson's Disease and suspected Lewy Body Dementia, stable. Tolerating added entacapone well and adjusting medication timing seems to be improving morning rigidity.  -Palliative on board, plans to follow as outpatient  -continue Sinemet25-100mg  1 tablet 4x daily with the added entacapone 200mg  with each sinemet administration -outpatient neurology follow up scheduled for 05/11/18 with [facility]  Urinary retention, stable. Patient likely has neurogenic bladder, she seems to periodically have long periods of time without spontaneous voiding following by urinary incontinence. No role for foley as can intermittent I&O cath based on bladder scans. Unlikely to be from medications - continue q6 bladder scans - intermittent I&O cath if bladder scan showing >700cc, avoid foley  L5 compression fracture s/p kyphoplasty. Unfortunately repeat L spine MRI on 04/19/18 showed a possible new L5 fracture with moderate spinal canal stenosis and severe bilateral neural foraminal stenosis. Unable to compare with preaugmentation study.  - Neurosurgery curbsided, recommended LSO brace for pain and outpatient follow-up on as-needed basis. - reconsulted PT to work with  mobility  MildProtein calorie malnutrition:  Patient appears to have intermittent good p.o. intake, yesterday she did not eat quite as well.  This is expected however but concerning for how she will manage nutrition at SNF. -continue dysphagia 1 diet -Monitor po intake -Nutrition recs goal for patient to meet >90% of needs, will continue to follow   Osteoporosis: patient with recent kyphoplasty and compression fracture however has progressive Parkinson and lewy body dementia so likely would not see benefit from bisphosphonates.  -monitor for pain   FEN/GI: dysphagia 1 diet PPx: lovenox  Disposition:   pending further clarification on p.o. intake with family, approaching medical readiness for discharge to SNF  Subjective:  No family or nursing at bedside this morning.  Patient denies pain.  Said "going home" and "call someone" nodding yes when asked if she meant her son.  She asked to be better covered up with her hospital gown.  She continues to vocalize word salad.  Objective: Temp:  [98.1 F (36.7 C)-98.6 F (37 C)] 98.6 F (37 C) (09/22 0431) Pulse Rate:  [93-112] 93 (09/22 0431) Resp:  [14-18] 16 (09/22 0431) BP: (135-149)/(60-127) 135/77 (09/22 0431) SpO2:  [92 %-94 %] 94 % (09/22 0431)  Physical Exam: General: Sitting up in bed, no acute distress Cardiovascular: Regular rate and rhythm, no murmurs Respiratory: Clear to auscultation bilaterally, normal work of breathing on room air Abd: Soft, nontender, nondistended, positive bowel sounds Derm: Warm and dry, no rashes Neuro: Awake and alert, more interactive today.  Follows simple commands and can occasionally answer yes/no questions appropriately.  Moves all 4 extremities without any residual rigidity this morning.  Does have unintelligible speech/mumbling.  Laboratory: BMP Latest Ref Rng & Units 04/23/2018 04/15/2018 04/14/2018  Glucose 70 - 99 mg/dL 621(H106(H) 086(V148(H) 784(O131(H)  BUN 8 -  23 mg/dL 16(X) 12 8  Creatinine  0.44 - 1.00 mg/dL 0.96 0.45 4.09  Sodium 135 - 145 mmol/L 145 140 140  Potassium 3.5 - 5.1 mmol/L 3.9 4.0 2.8(L)  Chloride 98 - 111 mmol/L 110 103 103  CO2 22 - 32 mmol/L 22 30 28   Calcium 8.9 - 10.3 mg/dL 9.2 8.9 8.1(X)     Imaging/Diagnostic Tests: Mr [name]  Result Date: 04/19/2018 CLINICAL DATA:  Back pain and recent kyphoplasty. Parkinson's disease. EXAM: MRI LUMBAR SPINE WITHOUT CONTRAST TECHNIQUE: Multiplanar, multisequence MR imaging of the lumbar spine was performed. No intravenous contrast was administered. COMPARISON:  Lumbar spine MRI 04/10/2018 FINDINGS: Segmentation:  Normal Alignment:  Normal Vertebrae: Status post vertebral augmentation at L5, where there is a severe compression fracture with approximately 90% vertebral body height loss. There is increased T2-weighted signal within the bone marrow, extending to the posterior elements. The marrow signal of the lumbar spine is otherwise normal. Moderate chronic height loss of L4 without edema. Conus medullaris and cauda equina: Conus extends to the L1 level. Conus and cauda equina appear normal. Paraspinal and other soft tissues: Negative Disc levels: T10-T11: Imaged only in the sagittal plane.  Normal. T11-T12: Normal. T12-L1: Normal. L1-L2: Normal disc space. Mild facet hypertrophy. No spinal canal or neural foraminal stenosis. L2-L3: Mild disc bulge without spinal canal or neural foraminal stenosis. Normal facets. L3-L4: Large left eccentric disc bulge narrowing both lateral recesses, left worse than right. No central spinal canal stenosis. There is moderate left neural foraminal stenosis. L4-L5: Severe facet hypertrophy with large disc bulge. Moderate spinal canal stenosis. Severe right and moderate left neural foraminal stenosis. L5-S1: Moderate narrowing of the spinal canal at the L5 level due to retropulsion. There is severe bilateral neural foraminal stenosis, right worse left. IMPRESSION: 1. Status post L5  vertebral augmentation without preoperative comparison. Severe height loss of L5 with mild retropulsion causing moderate spinal canal stenosis and severe bilateral neural foraminal stenosis. Moderate edema throughout, extending to the posterior elements. The findings may indicate a new fracture at the augmented level. Comparison to prior study, preferably the obtained before the vertebral augmentation, might be helpful. 2. L4-L5 severe right and moderate left neural foraminal stenosis with moderate spinal canal stenosis. 3. L3-L4 moderate left neural foraminal stenosis. 4. Chronic height loss of L4 without edema. Electronically Signed   By: Deatra Robinson M.D.   On: 04/19/2018 16:02   Dg Swallowing Func-speech Pathology  Result Date: 04/20/2018 Objective Swallowing Evaluation: Type of Study: MBS-Modified Barium Swallow Study  Patient Details Name: Suheily Pierce MRN: [id number] Date of Birth: 12/27/1935 Today's Date: 04/20/2018 Time: SLP Start Time (ACUTE ONLY): 0129 -SLP Stop Time (ACUTE ONLY): 0141 SLP Time Calculation (min) (ACUTE ONLY): 12 min Past Medical History: Past Medical History: Diagnosis Date . Benign essential HTN  . Depression  . HLD (hyperlipidemia)  . Neuropathy  . Parkinson's disease dementia [facility])  Past Surgical History: Past Surgical History: Procedure Laterality Date . L5 kyphoplasty Bilateral  HPI: 82 year old female admitted 04/10/18 with AMS, hallucinations, somnolence and difficulty communicating x1 week. PMH: Parkinson's disease, dementia, HTN, HLD, depression, neuropathy, GERD. CXR = RML atelectasis or PNA  Subjective: Pt resting in bed. No family present. Pt slightly more alert today. PT assisted ST to reposition pt to an upright position. Assessment / Plan / Recommendation CHL IP CLINICAL IMPRESSIONS 04/20/2018 Clinical Impression Pt presented with moderate oropharyngeal dysphagia, characterized by anterior loss, decreased control, cohesion and formation of bolus, penetration and  silent  penetration. Given pt's cogntive impairments secondary to dx dementia, she required max verbal cues to accept nectar, thin, and puree PO's (cues not to phonate during or after swallow). Anterior loss, lingual residue and decreased formation/cohesion noted across textures. Oral holding also observed with puree. Instances of silent penetration on anterior laryngeal vestibule with thin liquids were the result of decreased timing and coordination of protective mechanisms. Given airway intrusion with absence of sensation with thin and risk recommend continue nectar thick liquids, puree texture, crushed meds, upright position, no distractions, check oral cavity and full supervision/assist.  SLP Visit Diagnosis Dysphagia, oropharyngeal phase (R13.12) Attention and concentration deficit following -- Frontal lobe and executive function deficit following -- Impact on safety and function Moderate aspiration risk   CHL IP TREATMENT RECOMMENDATION 04/20/2018 Treatment Recommendations Therapy as outlined in treatment plan below   Prognosis 04/20/2018 Prognosis for Safe Diet Advancement Fair Barriers to Reach Goals Cognitive deficits;Severity of deficits Barriers/Prognosis Comment -- CHL IP DIET RECOMMENDATION 04/20/2018 SLP Diet Recommendations Dysphagia 1 (Puree) solids;Nectar thick liquid Liquid Administration via Cup;Straw Medication Administration Crushed with puree Compensations Minimize environmental distractions;Lingual sweep for clearance of pocketing;Small sips/bites;Slow rate;Monitor for anterior loss;Multiple dry swallows after each bite/sip Postural Changes Seated upright at 90 degrees   CHL IP OTHER RECOMMENDATIONS 04/20/2018 Recommended Consults -- Oral Care Recommendations Oral care BID Other Recommendations --   CHL IP FOLLOW UP RECOMMENDATIONS 04/20/2018 Follow up Recommendations Skilled Nursing facility   [facility] IP FREQUENCY AND DURATION 04/20/2018 Speech Therapy Frequency (ACUTE ONLY) min 2x/week Treatment Duration 2  weeks      CHL IP ORAL PHASE 04/20/2018 Oral Phase Impaired Oral - Pudding Teaspoon NT Oral - Pudding Cup NT Oral - Honey Teaspoon -- Oral - Honey Cup -- Oral - Nectar Teaspoon -- Oral - Nectar Cup -- Oral - Nectar Straw Lingual/palatal residue;Right anterior bolus loss;Left anterior bolus loss Oral - Thin Teaspoon NT Oral - Thin Cup NT Oral - Thin Straw Left anterior bolus loss;Right anterior bolus loss;Lingual/palatal residue;Decreased bolus cohesion Oral - Puree -- Oral - Mech Soft -- Oral - Regular -- Oral - Multi-Consistency -- Oral - Pill -- Oral Phase - Comment --  CHL IP PHARYNGEAL PHASE 04/20/2018 Pharyngeal Phase Impaired Pharyngeal- Pudding Teaspoon NT Pharyngeal -- Pharyngeal- Pudding Cup NT Pharyngeal -- Pharyngeal- Honey Teaspoon -- Pharyngeal -- Pharyngeal- Honey Cup -- Pharyngeal -- Pharyngeal- Nectar Teaspoon -- Pharyngeal -- Pharyngeal- Nectar Cup -- Pharyngeal -- Pharyngeal- Nectar Straw -- Pharyngeal -- Pharyngeal- Thin Teaspoon NT Pharyngeal -- Pharyngeal- Thin Cup NT Pharyngeal -- Pharyngeal- Thin Straw Penetration/Aspiration during swallow Pharyngeal Material enters airway, remains ABOVE vocal cords and not ejected out Pharyngeal- Puree -- Pharyngeal -- Pharyngeal- Mechanical Soft -- Pharyngeal -- Pharyngeal- Regular -- Pharyngeal -- Pharyngeal- Multi-consistency -- Pharyngeal -- Pharyngeal- Pill -- Pharyngeal -- Pharyngeal Comment --  CHL IP CERVICAL ESOPHAGEAL PHASE 04/20/2018 Cervical Esophageal Phase WFL Pudding Teaspoon -- Pudding Cup -- Honey Teaspoon -- Honey Cup -- Nectar Teaspoon -- Nectar Cup -- Nectar Straw -- Thin Teaspoon -- Thin Cup -- Thin Straw -- Puree -- Mechanical Soft -- Regular -- Multi-consistency -- Pill -- Cervical Esophageal Comment -- Royce Macadamia 04/20/2018, 3:23 PM Breck Coons Litaker M.Ed Sports administrator Pager [phone] Office [phone]               Leland Her, DO PGY-3, [facility] 04/23/2018 8:23 AM

## 2018-04-24 MED ORDER — CARBIDOPA-LEVODOPA 25-100 MG PO TABS: 1.0000 | ORAL_TABLET | Freq: Four times a day (QID) | ORAL | 0 refills | Status: DC

## 2018-04-24 MED ORDER — ENTACAPONE 200 MG PO TABS: 200.0000 mg | ORAL_TABLET | Freq: Four times a day (QID) | ORAL | 0 refills | Status: DC

## 2018-04-24 NOTE — Clinical Social Work Placement (Signed)
   CLINICAL SOCIAL WORK PLACEMENT  NOTE 12/22/17 - [facility]  Date:  04/24/2018  Patient Details  Name: Debra Pierce MRN: [id number] Date of Birth: 07-08-1936  Clinical Social Work is seeking post-discharge placement for this patient at the   level of care (*CSW will initial, date and re-position this form in  chart as items are completed):  Yes   Patient/family provided with [facility] Clinical Social Work Department's list of facilities offering this level of care within the geographic area requested by the patient (or if unable, by the patient's family).  Yes   Patient/family informed of their freedom to choose among providers that offer the needed level of care, that participate in Medicare, Medicaid or managed care program needed by the patient, have an available bed and are willing to accept the patient.  Yes   Patient/family informed of 's ownership interest in [facility] and [facility], as well as of the fact that they are under no obligation to receive care at these facilities.  PASRR submitted to EDS on 04/14/18     PASRR number received on 04/21/18     Existing PASRR number confirmed on       FL2 transmitted to all facilities in geographic area requested by pt/family on 04/20/18     FL2 transmitted to all facilities within larger geographic area on       Patient informed that his/her managed care company has contracts with or will negotiate with certain facilities, including the following:        Yes   Patient/family informed of bed offers received.  [facility]     Physician recommends and patient chooses bed at      [facility].  Patient to be transferred to facility by Ambulance     Patient family notified on 04/24/18 of transfer.  Name of family member notified:  Debra Pierce - son; at the hospital     PHYSICIAN       Additional Comment:     _______________________________________________ Cristobal Goldmannrawford, Nimisha Rathel Bradley, LCSW 04/24/2018, 7:17 PM

## 2018-04-24 NOTE — Progress Notes (Signed)
Called report to Dewayne HatchAnn, Charity fundraiserN at [facility].

## 2018-04-24 NOTE — Progress Notes (Signed)
Family Medicine returned page.  No in and out at this time, want to wait and see if bladder gives urge to urinate.  Will follow-up with me a little later in reference to.

## 2018-04-24 NOTE — Progress Notes (Signed)
Paged Family Medicine bladder scan 526. FYI

## 2018-04-24 NOTE — Progress Notes (Signed)
  Speech Language Pathology Treatment: Dysphagia  Patient Details Name: Debra Pierce MRN: [id number] DOB: 10/30/1935 Today's Date: 04/24/2018 Time: [id number] SLP Time Calculation (min) (ACUTE ONLY): 11 min  Assessment / Plan / Recommendation Clinical Impression  Pt exhibited markedly increased alertness and responsiveness to max verbal and tactile cues from clinician compared previous SLP visit. Pt found with moderate lingual and labial PO residue, consistent with pt's current oral dysphagia symptoms. After oral care, pt consumed several sips of nectar without overt s/s aspiration. Nurse tech reported pt continues to exhibit pocketing of PO's during meals therefore did not trial higher textures of solids. Given risk for aspiration and severity of oral phase impairments, recommend continue current diet (puree, nectar), and follow x 1 for continued education with son.    HPI HPI: 82 year old female admitted 04/10/18 with AMS, hallucinations, somnolence and difficulty communicating x1 week. PMH: Parkinson's disease, dementia, HTN, HLD, depression, neuropathy, GERD. CXR = RML atelectasis or PNA       SLP Plan  Continue with current plan of care       Recommendations  Diet recommendations: Dysphagia 1 (puree);Nectar-thick liquid Liquids provided via: Cup;Teaspoon;Straw Medication Administration: Crushed with puree Supervision: Full supervision/cueing for compensatory strategies;Staff to assist with self feeding Compensations: Minimize environmental distractions;Lingual sweep for clearance of pocketing;Small sips/bites;Slow rate;Monitor for anterior loss;Multiple dry swallows after each bite/sip Postural Changes and/or Swallow Maneuvers: Seated upright 90 degrees                Oral Care Recommendations: Oral care BID Follow up Recommendations: Skilled Nursing facility SLP Visit Diagnosis: Dysphagia, oropharyngeal phase (R13.12) Plan: Continue with current plan of care       Suzzette RighterErin  Zackory Pudlo, Student SLP                Suzzette RighterErin Maryruth Apple 04/24/2018, 10:54 AM

## 2018-04-25 ENCOUNTER — Non-Acute Institutional Stay: Payer: Medicare Other | Admitting: Internal Medicine

## 2018-04-25 DIAGNOSIS — Z515 Encounter for palliative care: Secondary | ICD-10-CM

## 2018-04-25 DIAGNOSIS — R413 Other amnesia: Secondary | ICD-10-CM

## 2018-05-02 ENCOUNTER — Other Ambulatory Visit: Payer: Self-pay

## 2018-05-02 ENCOUNTER — Emergency Department (HOSPITAL_COMMUNITY): Payer: Medicare Other

## 2018-05-02 ENCOUNTER — Encounter (HOSPITAL_COMMUNITY): Payer: Self-pay | Admitting: Emergency Medicine

## 2018-05-02 ENCOUNTER — Observation Stay (HOSPITAL_COMMUNITY): Payer: Medicare Other

## 2018-05-02 ENCOUNTER — Inpatient Hospital Stay (HOSPITAL_COMMUNITY)
Admission: EM | Admit: 2018-05-02 | Discharge: 2018-05-07 | DRG: 640 | Disposition: A | Discharge status: Hospice | Payer: Medicare Other | Source: Skilled Nursing Facility | Attending: Family Medicine | Admitting: Family Medicine

## 2018-05-02 DIAGNOSIS — E876 Hypokalemia: Secondary | ICD-10-CM | POA: Diagnosis present

## 2018-05-02 DIAGNOSIS — I10 Essential (primary) hypertension: Secondary | ICD-10-CM | POA: Diagnosis present

## 2018-05-02 DIAGNOSIS — Z993 Dependence on wheelchair: Secondary | ICD-10-CM

## 2018-05-02 DIAGNOSIS — N3000 Acute cystitis without hematuria: Secondary | ICD-10-CM | POA: Diagnosis not present

## 2018-05-02 DIAGNOSIS — Z66 Do not resuscitate: Secondary | ICD-10-CM | POA: Diagnosis present

## 2018-05-02 DIAGNOSIS — E860 Dehydration: Secondary | ICD-10-CM | POA: Diagnosis not present

## 2018-05-02 DIAGNOSIS — G3183 Dementia with Lewy bodies: Secondary | ICD-10-CM | POA: Diagnosis present

## 2018-05-02 DIAGNOSIS — E870 Hyperosmolality and hypernatremia: Secondary | ICD-10-CM | POA: Diagnosis not present

## 2018-05-02 DIAGNOSIS — G20 Parkinson's disease: Secondary | ICD-10-CM

## 2018-05-02 DIAGNOSIS — Z79899 Other long term (current) drug therapy: Secondary | ICD-10-CM

## 2018-05-02 DIAGNOSIS — K219 Gastro-esophageal reflux disease without esophagitis: Secondary | ICD-10-CM | POA: Diagnosis present

## 2018-05-02 DIAGNOSIS — F0280 Dementia in other diseases classified elsewhere without behavioral disturbance: Secondary | ICD-10-CM | POA: Diagnosis present

## 2018-05-02 DIAGNOSIS — Z7401 Bed confinement status: Secondary | ICD-10-CM

## 2018-05-02 DIAGNOSIS — G9340 Encephalopathy, unspecified: Secondary | ICD-10-CM | POA: Diagnosis present

## 2018-05-02 DIAGNOSIS — Z515 Encounter for palliative care: Secondary | ICD-10-CM | POA: Diagnosis present

## 2018-05-02 DIAGNOSIS — A0472 Enterocolitis due to Clostridium difficile, not specified as recurrent: Secondary | ICD-10-CM | POA: Diagnosis not present

## 2018-05-02 DIAGNOSIS — I447 Left bundle-branch block, unspecified: Secondary | ICD-10-CM | POA: Diagnosis present

## 2018-05-02 DIAGNOSIS — N179 Acute kidney failure, unspecified: Secondary | ICD-10-CM | POA: Diagnosis present

## 2018-05-02 DIAGNOSIS — R404 Transient alteration of awareness: Secondary | ICD-10-CM

## 2018-05-02 DIAGNOSIS — G9341 Metabolic encephalopathy: Secondary | ICD-10-CM | POA: Diagnosis present

## 2018-05-02 DIAGNOSIS — R4182 Altered mental status, unspecified: Secondary | ICD-10-CM | POA: Diagnosis not present

## 2018-05-02 DIAGNOSIS — E785 Hyperlipidemia, unspecified: Secondary | ICD-10-CM | POA: Diagnosis present

## 2018-05-02 DIAGNOSIS — N390 Urinary tract infection, site not specified: Secondary | ICD-10-CM | POA: Diagnosis present

## 2018-05-02 LAB — COMPREHENSIVE METABOLIC PANEL
ALT: 9 U/L (ref 0–44)
AST: 24 U/L (ref 15–41)
Albumin: 2.9 g/dL — ABNORMAL LOW (ref 3.5–5.0)
Alkaline Phosphatase: 67 U/L (ref 38–126)
Anion gap: 8 (ref 5–15)
BUN: 64 mg/dL — ABNORMAL HIGH (ref 8–23)
CHLORIDE: 121 mmol/L — AB (ref 98–111)
CO2: 25 mmol/L (ref 22–32)
Calcium: 7.4 mg/dL — ABNORMAL LOW (ref 8.9–10.3)
Creatinine, Ser: 1.5 mg/dL — ABNORMAL HIGH (ref 0.44–1.00)
GFR, EST AFRICAN AMERICAN: 37 mL/min — AB (ref >60)
GFR, EST NON AFRICAN AMERICAN: 32 mL/min — AB (ref >60)
Glucose, Bld: 100 mg/dL — ABNORMAL HIGH (ref 70–99)
POTASSIUM: 3.1 mmol/L — AB (ref 3.5–5.1)
Sodium: 154 mmol/L — ABNORMAL HIGH (ref 135–145)
Total Bilirubin: 0.6 mg/dL (ref 0.3–1.2)
Total Protein: 5.8 g/dL — ABNORMAL LOW (ref 6.5–8.1)

## 2018-05-02 LAB — CBG MONITORING, ED: Glucose-Capillary: 103 mg/dL — ABNORMAL HIGH (ref 70–99)

## 2018-05-02 LAB — I-STAT CHEM 8, ED
BUN: 67 mg/dL — ABNORMAL HIGH (ref 8–23)
CREATININE: 1.5 mg/dL — AB (ref 0.44–1.00)
Calcium, Ion: 1.04 mmol/L — ABNORMAL LOW (ref 1.15–1.40)
Chloride: 122 mmol/L — ABNORMAL HIGH (ref 98–111)
Glucose, Bld: 100 mg/dL — ABNORMAL HIGH (ref 70–99)
HEMATOCRIT: 45 % (ref 36.0–46.0)
HEMOGLOBIN: 15.3 g/dL — AB (ref 12.0–15.0)
Potassium: 3.2 mmol/L — ABNORMAL LOW (ref 3.5–5.1)
Sodium: 159 mmol/L — ABNORMAL HIGH (ref 135–145)
TCO2: 27 mmol/L (ref 22–32)

## 2018-05-02 LAB — URINALYSIS, ROUTINE W REFLEX MICROSCOPIC
BILIRUBIN URINE: NEGATIVE
Glucose, UA: NEGATIVE mg/dL
Ketones, ur: 5 mg/dL — AB
NITRITE: NEGATIVE
Protein, ur: NEGATIVE mg/dL
Specific Gravity, Urine: 1.021 (ref 1.005–1.030)
pH: 5 (ref 5.0–8.0)

## 2018-05-02 LAB — CBC
HEMATOCRIT: 44.3 % (ref 36.0–46.0)
Hemoglobin: 13.6 g/dL (ref 12.0–15.0)
MCH: 31.2 pg (ref 26.0–34.0)
MCHC: 30.7 g/dL (ref 30.0–36.0)
MCV: 101.6 fL — AB (ref 78.0–100.0)
Platelets: 157 10*3/uL (ref 150–400)
RBC: 4.36 MIL/uL (ref 3.87–5.11)
RDW: 15.6 % — ABNORMAL HIGH (ref 11.5–15.5)
WBC: 11.2 10*3/uL — AB (ref 4.0–10.5)

## 2018-05-02 LAB — MAGNESIUM: Magnesium: 2.3 mg/dL (ref 1.7–2.4)

## 2018-05-02 LAB — I-STAT CG4 LACTIC ACID, ED: Lactic Acid, Venous: 1.72 mmol/L (ref 0.5–1.9)

## 2018-05-02 MED ORDER — POTASSIUM CHLORIDE 10 MEQ/100ML IV SOLN
10.0000 meq | Freq: Once | INTRAVENOUS | Status: AC
  Administered 2018-05-02: 10 meq via INTRAVENOUS
  Filled 2018-05-02: qty 100

## 2018-05-02 MED ORDER — ONDANSETRON HCL 4 MG/2ML IJ SOLN: 4.0000 mg | Freq: Four times a day (QID) | INTRAMUSCULAR | Status: DC | PRN

## 2018-05-02 MED ORDER — SODIUM CHLORIDE 0.9 % IV SOLN
1.0000 g | INTRAVENOUS | Status: DC
  Administered 2018-05-03 – 2018-05-06 (×4): 1 g via INTRAVENOUS
  Filled 2018-05-02 (×4): qty 1
  Filled 2018-05-02: qty 10

## 2018-05-02 MED ORDER — MAGNESIUM SULFATE 2 GM/50ML IV SOLN
2.0000 g | Freq: Once | INTRAVENOUS | Status: DC
  Filled 2018-05-02: qty 50

## 2018-05-02 MED ORDER — ENOXAPARIN SODIUM 30 MG/0.3ML ~~LOC~~ SOLN
30.0000 mg | SUBCUTANEOUS | Status: DC
  Administered 2018-05-02 – 2018-05-06 (×5): 30 mg via SUBCUTANEOUS
  Filled 2018-05-02 (×5): qty 0.3

## 2018-05-02 MED ORDER — CARBIDOPA-LEVODOPA 25-100 MG PO TABS
1.0000 | ORAL_TABLET | Freq: Four times a day (QID) | ORAL | Status: DC
  Administered 2018-05-02 – 2018-05-03 (×4): 1 via ORAL
  Filled 2018-05-02 (×4): qty 1

## 2018-05-02 MED ORDER — ENTACAPONE 200 MG PO TABS
200.0000 mg | ORAL_TABLET | Freq: Four times a day (QID) | ORAL | Status: DC
  Administered 2018-05-02 – 2018-05-03 (×4): 200 mg via ORAL
  Filled 2018-05-02 (×6): qty 1

## 2018-05-02 MED ORDER — ENSURE ENLIVE PO LIQD
237.0000 mL | Freq: Three times a day (TID) | ORAL | Status: DC
  Administered 2018-05-03 (×2): 237 mL via ORAL

## 2018-05-02 MED ORDER — ACETAMINOPHEN 325 MG PO TABS
650.0000 mg | ORAL_TABLET | Freq: Four times a day (QID) | ORAL | Status: DC | PRN
  Administered 2018-05-02: 650 mg via ORAL
  Filled 2018-05-02: qty 2

## 2018-05-02 MED ORDER — DEXTROSE 5 % IV SOLN
INTRAVENOUS | Status: AC
  Administered 2018-05-03 (×2): via INTRAVENOUS

## 2018-05-02 MED ORDER — ADULT MULTIVITAMIN W/MINERALS CH
1.0000 | ORAL_TABLET | Freq: Every day | ORAL | Status: DC
  Administered 2018-05-03 – 2018-05-04 (×2): 1 via ORAL
  Filled 2018-05-02 (×2): qty 1

## 2018-05-02 MED ORDER — RESOURCE THICKENUP CLEAR PO POWD
ORAL | Status: DC | PRN
  Filled 2018-05-02: qty 125

## 2018-05-02 MED ORDER — LACTATED RINGERS IV BOLUS
1000.0000 mL | Freq: Once | INTRAVENOUS | Status: AC
  Administered 2018-05-02: 1000 mL via INTRAVENOUS

## 2018-05-02 MED ORDER — STARCH (THICKENING) PO POWD: ORAL | Status: DC | PRN

## 2018-05-02 MED ORDER — SODIUM CHLORIDE 0.9 % IV SOLN
1.0000 g | Freq: Once | INTRAVENOUS | Status: AC
  Administered 2018-05-03: 1 g via INTRAVENOUS
  Filled 2018-05-02: qty 1

## 2018-05-02 MED ORDER — SERTRALINE HCL 25 MG PO TABS
25.0000 mg | ORAL_TABLET | Freq: Every day | ORAL | Status: DC
  Administered 2018-05-02 – 2018-05-03 (×2): 25 mg via ORAL
  Filled 2018-05-02 (×2): qty 1

## 2018-05-02 MED ORDER — ONDANSETRON HCL 4 MG PO TABS: 4.0000 mg | ORAL_TABLET | Freq: Four times a day (QID) | ORAL | Status: DC | PRN

## 2018-05-02 MED ORDER — ACETAMINOPHEN 650 MG RE SUPP: 650.0000 mg | Freq: Four times a day (QID) | RECTAL | Status: DC | PRN

## 2018-05-02 NOTE — ED Notes (Signed)
Pt is alert with eye opening response to voice stimulation and is verbal with incomprehensible sounds. Pt son is at bedside, CBG 103

## 2018-05-02 NOTE — ED Triage Notes (Signed)
The facility reported that the patient is having increased AMS for 2 days. They also reported labs revealed an increase in sodium and chloride along with a BP of 60/0. EMS was called and administered 1/2 NS. They reported clear lung sounds and a left bundle branch block. She is A&Ox0 which is her baseline.

## 2018-05-02 NOTE — ED Notes (Signed)
Bed: ZO10 Expected date:  Expected time:  Means of arrival:  Comments: Altered-EMS

## 2018-05-02 NOTE — Progress Notes (Signed)
Attempted to call and get report, unsuccessful at this time. I will try to call again.

## 2018-05-02 NOTE — ED Provider Notes (Signed)
Emergency Department Provider Note   I have reviewed the triage vital signs and the nursing notes.   HISTORY  Chief Complaint Altered Mental Status   HPI Debra Pierce is a 82 y.o. female with multiple medical problems as documented below the presents the emergency department today for altered mental status.  Patient was just recently discharged for worsening delirium and increase in Sinemet.  She came home a week ago.  She did initially well but around Friday or Saturday she had decreasing mental status and not responding per her normal self.  Throughout the weekend she had decreased p.o. intake and continued to not drink fluids as well.  Apparently earlier today patient was very sleepy and was not easily arousable so they sent her here for further evaluation.  Son is at bedside and provides most of the history. No other associated or modifying symptoms.    Past Medical History:  Diagnosis Date  . Benign essential HTN   . Depression   . HLD (hyperlipidemia)   . Neuropathy   . Parkinson's disease dementia [facility])     Patient Active Problem List   Diagnosis Date Noted  . Acute encephalopathy 05/02/2018  . ARF (acute renal failure) (HCC) 05/02/2018  . Hypernatremia 05/02/2018  . Hypokalemia 05/02/2018  . Acute lower UTI 05/02/2018  . Acute cystitis without hematuria   . AKI (acute kidney injury) (HCC)   . Fecal smearing   . Urinary retention   . Vertebral compression fracture (HCC)   . H/O kyphoplasty   . Weakness of both lower extremities   . Poor fluid intake   . Palliative care encounter   . Community acquired pneumonia   . HCAP (healthcare-associated pneumonia)   . Dementia due to Parkinson's disease without behavioral disturbance (HCC)   . Altered mental status 04/10/2018    Past Surgical History:  Procedure Laterality Date  . L5 kyphoplasty Bilateral       Allergies Codeine and Hydrocodone  History reviewed. No pertinent family history.  Social  History Social History   Tobacco Use  . Smoking status: Never Smoker  . Smokeless tobacco: Never Used  Substance Use Topics  . Alcohol use: Not Currently  . Drug use: Never    Review of Systems  All other systems negative except as documented in the HPI. All pertinent positives and negatives as reviewed in the HPI. ____________________________________________   PHYSICAL EXAM:  VITAL SIGNS: ED Triage Vitals  Enc Vitals Group     BP 05/02/18 1505 (!) 117/49     Pulse Rate 05/02/18 1513 96     Resp 05/02/18 1513 18     Temp 05/02/18 1507 (!) 97.4 F (36.3 C)     Temp Source 05/02/18 1507 Oral     SpO2 05/02/18 1513 96 %    Constitutional: Alert and not conversant. no acute distress. Eyes: Conjunctivae are normal. PERRL. Right eye slightly deviated laterally.  Eyes sunken. Head: Atraumatic. Dried yellow food around mouth Nose: No congestion/rhinnorhea. Mouth/Throat: Mucous membranes are dry.  Oropharynx non-erythematous. Neck: No stridor.  No meningeal signs.   Cardiovascular: tachycardic rate, regular rhythm. Good peripheral circulation. Grossly normal heart sounds.   Respiratory: difficult to auscultate bs secondary to poor effort. But given limitations lungs are clear. No distress. Gastrointestinal: Soft and nontender. No distention.  Musculoskeletal: No lower extremity tenderness nor edema. No gross deformities of extremities. Neurologic:  Normal speech and language. No gross focal neurologic deficits are appreciated.  Skin:  Skin is warm, dry.  Has 1 cm well healilng wound to right shin. No rash noted.  Positive for skin tenting.  ____________________________________________   LABS (all labs ordered are listed, but only abnormal results are displayed)  Labs Reviewed  COMPREHENSIVE METABOLIC PANEL - Abnormal; Notable for the following components:      Result Value   Sodium 154 (*)    Potassium 3.1 (*)    Chloride 121 (*)    Glucose, Bld 100 (*)    BUN 64 (*)     Creatinine, Ser 1.50 (*)    Calcium 7.4 (*)    Total Protein 5.8 (*)    Albumin 2.9 (*)    GFR calc non Af Amer 32 (*)    GFR calc Af Amer 37 (*)    All other components within normal limits  CBC - Abnormal; Notable for the following components:   WBC 11.2 (*)    MCV 101.6 (*)    RDW 15.6 (*)    All other components within normal limits  URINALYSIS, ROUTINE W REFLEX MICROSCOPIC - Abnormal; Notable for the following components:   Color, Urine AMBER (*)    APPearance HAZY (*)    Hgb urine dipstick SMALL (*)    Ketones, ur 5 (*)    Leukocytes, UA MODERATE (*)    WBC, UA >50 (*)    Bacteria, UA MANY (*)    All other components within normal limits  CBG MONITORING, ED - Abnormal; Notable for the following components:   Glucose-Capillary 103 (*)    All other components within normal limits  I-STAT CHEM 8, ED - Abnormal; Notable for the following components:   Sodium 159 (*)    Potassium 3.2 (*)    Chloride 122 (*)    BUN 67 (*)    Creatinine, Ser 1.50 (*)    Glucose, Bld 100 (*)    Calcium, Ion 1.04 (*)    Hemoglobin 15.3 (*)    All other components within normal limits  URINE CULTURE  C DIFFICILE QUICK SCREEN W PCR REFLEX  MAGNESIUM  BASIC METABOLIC PANEL  CBC  MAGNESIUM  I-STAT CG4 LACTIC ACID, ED   ____________________________________________  EKG   EKG Interpretation  Date/Time:  Tuesday May 02 2018 17:11:01 EDT Ventricular Rate:  92 PR Interval:    QRS Duration: 128 QT Interval:  391 QTC Calculation: 484 R Axis:   1 Text Interpretation:  Sinus rhythm Left bundle branch block No significant change since last tracing Confirmed by Marily Memos [phone]) on 05/02/2018 5:27:15 PM       ____________________________________________  RADIOLOGY  Ct Abdomen Pelvis Wo Contrast  Result Date: 05/02/2018 CLINICAL DATA:  Abdominal distension. EXAM: CT ABDOMEN AND PELVIS WITHOUT CONTRAST TECHNIQUE: Multidetector CT imaging of the abdomen and pelvis was performed  following the standard protocol without IV contrast. COMPARISON:  Radiograph 04/10/2018 FINDINGS: Lower chest: Linear atelectasis in both lower lobes. Heart is normal in size. Coronary artery calcifications. Hepatobiliary: No focal hepatic lesion allowing for lack contrast. Mild gallbladder distention without wall thickening or pericholecystic inflammation. No calcified gallstone. No biliary dilatation. Pancreas: Parenchymal atrophy. No ductal dilatation or inflammation. Spleen: Normal in size without focal abnormality. Adrenals/Urinary Tract: No adrenal nodule. No hydronephrosis or perinephric edema. No urolithiasis. Small exophytic cyst from the lower right kidney. Ureters are decompressed. Urinary bladder is physiologically distended. Stomach/Bowel: Small to moderate hiatal hernia. Stomach is otherwise nondistended. No bowel obstruction. No evidence of bowel wall thickening or inflammatory change. The appendix is normal. Colonic diverticulosis without  diverticulitis. There is transverse colonic tortuosity. Colonic interposition of the hepatic flexure lateral to the liver. Vascular/Lymphatic: Aorto bi-iliac atherosclerosis. No enlarged abdominal or pelvic lymph nodes. Reproductive: Few coarse uterine calcifications may be vascular or fibroids. No adnexal mass. Other: No free air, free fluid, or intra-abdominal fluid collection. Musculoskeletal: Kyphoplasty within L5 compression fracture. Unchanged L4 compression fracture compared to recent lumbar spine MRI. There are no acute or suspicious osseous abnormalities. IMPRESSION: 1. No acute findings or explanation for abdominal distention. 2. Colonic diverticulosis without diverticulitis. Small to moderate hiatal hernia. 3.  Aortic Atherosclerosis (ICD10-I70.0). Electronically Signed   By: Narda Rutherford M.D.   On: 05/02/2018 23:00   Dg Chest 2 View  Result Date: 05/02/2018 CLINICAL DATA:  Increased altered mental status over the last 2 days. EXAM: CHEST - 2 VIEW  COMPARISON:  04/10/2018 FINDINGS: Opacity at the right lung base is stable from the prior exam, consistent with atelectasis. Lungs otherwise clear. Cardiac silhouette is normal in size. No mediastinal or hilar masses. No evidence of adenopathy No pleural effusion or pneumothorax. Skeletal structures are intact. IMPRESSION: No active cardiopulmonary disease. Electronically Signed   By: Amie Portland M.D.   On: 05/02/2018 17:11    ____________________________________________   PROCEDURES  Procedure(s) performed:   Procedures   ____________________________________________   INITIAL IMPRESSION / ASSESSMENT AND PLAN / ED COURSE  Medication side effect versus dehydration versus infection versus metabolic issues.  Will evaluate for the same but patient is not back to baseline will likely need to be readmitted to the hospital.  Patient found to have urinary tract infection and acute kidney injury.  Started on Rocephin, fluids given will admit to the hospital.  Pertinent labs & imaging results that were available during my care of the patient were reviewed by me and considered in my medical decision making (see chart for details).  ____________________________________________  FINAL CLINICAL IMPRESSION(S) / ED DIAGNOSES  Final diagnoses:  Transient alteration of awareness  AKI (acute kidney injury) (HCC)  Dehydration  Acute cystitis without hematuria     MEDICATIONS GIVEN DURING THIS VISIT:  Medications  magnesium sulfate IVPB 2 g 50 mL (has no administration in time range)  cefTRIAXone (ROCEPHIN) 1 g in sodium chloride 0.9 % 100 mL IVPB (has no administration in time range)  sertraline (ZOLOFT) tablet 25 mg (25 mg Oral Given 05/02/18 2353)  carbidopa-levodopa (SINEMET IR) 25-100 MG per tablet immediate release 1 tablet (1 tablet Oral Given 05/02/18 2353)  entacapone (COMTAN) tablet 200 mg (200 mg Oral Given 05/02/18 2354)  feeding supplement (ENSURE ENLIVE) (ENSURE ENLIVE) liquid  237 mL (0 mLs Oral Hold 05/03/18 0008)  multivitamin with minerals tablet 1 tablet (has no administration in time range)  acetaminophen (TYLENOL) tablet 650 mg (650 mg Oral Given 05/02/18 2354)    Or  acetaminophen (TYLENOL) suppository 650 mg ( Rectal See Alternative 05/02/18 2354)  ondansetron (ZOFRAN) tablet 4 mg (has no administration in time range)    Or  ondansetron (ZOFRAN) injection 4 mg (has no administration in time range)  enoxaparin (LOVENOX) injection 30 mg (30 mg Subcutaneous Given 05/02/18 2355)  dextrose 5 % solution (has no administration in time range)  cefTRIAXone (ROCEPHIN) 1 g in sodium chloride 0.9 % 100 mL IVPB (has no administration in time range)  RESOURCE THICKENUP CLEAR (has no administration in time range)  lactated ringers bolus 1,000 mL (0 mLs Intravenous Stopped 05/02/18 1908)  potassium chloride 10 mEq in 100 mL IVPB (10 mEq Intravenous New Bag/Given 05/02/18  2000)     NEW OUTPATIENT MEDICATIONS STARTED DURING THIS VISIT:  Current Discharge Medication List      Note:  This note was prepared with assistance of Dragon voice recognition software. Occasional wrong-word or sound-a-like substitutions may have occurred due to the inherent limitations of voice recognition software.   Garrin Kirwan, Barbara Cower, MD 05/03/18 [phone]

## 2018-05-02 NOTE — ED Notes (Signed)
ED TO INPATIENT HANDOFF REPORT  Name/Age/Gender Debra Pierce 82 y.o. female  Code Status    Code Status Orders  (From admission, onward)         Start     Ordered   05/02/18 2002  Full code  Continuous     05/02/18 2004        Code Status History    Date Active Date Inactive Code Status Order ID Comments User Context   04/10/2018 1750 04/24/2018 2157 Full Code [id number]  Shirley, [place], DO Inpatient    Advance Directive Documentation     Most Recent Value  Type of Advance Directive  Healthcare Power of Attorney  Pre-existing out of facility DNR order (yellow form or pink MOST form)  -  "MOST" Form in Place?  -      Home/SNF/Other Skilled nursing facility  Chief Complaint Altered mental status  Level of Care/Admitting Diagnosis ED Disposition    ED Disposition Condition [place]: [facility] [[id number]]  Level of Care: Telemetry [5]  Admit to tele based on following criteria: Monitor for Ischemic changes  Diagnosis: Acute encephalopathy [706237]  Admitting Physician: Rise Patience [phone]  Attending Physician: Rise Patience [[id number]]  PT Class (Do Not Modify): Observation [104]  PT Acc Code (Do Not Modify): Observation [10022]       Medical History Past Medical History:  Diagnosis Date  . Benign essential HTN   . Depression   . HLD (hyperlipidemia)   . Neuropathy   . Parkinson's disease dementia ([place])     Allergies Allergies  Allergen Reactions  . Codeine Other (See Comments)    Family stated unknown reaction  . Hydrocodone     IV Location/Drains/Wounds Patient Lines/Drains/Airways Status   Active Line/Drains/Airways    Name:   Placement date:   Placement time:   Site:   Days:   Peripheral IV 04/11/18 Right;Anterior;Upper Arm   04/11/18    2136    Arm   21   Peripheral IV 05/02/18 Right Antecubital   05/02/18    1536    Antecubital   less than 1          Labs/Imaging Results for orders  placed or performed during the hospital encounter of 05/02/18 (from the past 48 hour(s))  Comprehensive metabolic panel     Status: Abnormal   Collection Time: 05/02/18  3:38 PM  Result Value Ref Range   Sodium 154 (H) 135 - 145 mmol/L   Potassium 3.1 (L) 3.5 - 5.1 mmol/L   Chloride 121 (H) 98 - 111 mmol/L   CO2 25 22 - 32 mmol/L   Glucose, Bld 100 (H) 70 - 99 mg/dL   BUN 64 (H) 8 - 23 mg/dL   Creatinine, Ser 1.50 (H) 0.44 - 1.00 mg/dL   Calcium 7.4 (L) 8.9 - 10.3 mg/dL   Total Protein 5.8 (L) 6.5 - 8.1 g/dL   Albumin 2.9 (L) 3.5 - 5.0 g/dL   AST 24 15 - 41 U/L   ALT 9 0 - 44 U/L   Alkaline Phosphatase 67 38 - 126 U/L   Total Bilirubin 0.6 0.3 - 1.2 mg/dL   GFR calc non Af Amer 32 (L) >60 mL/min   GFR calc Af Amer 37 (L) >60 mL/min    Comment: (NOTE) The eGFR has been calculated using the CKD EPI equation. This calculation has not been validated in all clinical situations. eGFR's persistently <60 mL/min signify possible Chronic  Kidney Disease.    Anion gap 8 5 - 15    Comment: Performed at [facility], [place] [address], [place], [place] [address]  CBC     Status: Abnormal   Collection Time: 05/02/18  3:38 PM  Result Value Ref Range   WBC 11.2 (H) 4.0 - 10.5 K/uL   RBC 4.36 3.87 - 5.11 MIL/uL   Hemoglobin 13.6 12.0 - 15.0 g/dL   HCT 44.3 36.0 - 46.0 %   MCV 101.6 (H) 78.0 - 100.0 fL   MCH 31.2 26.0 - 34.0 pg   MCHC 30.7 30.0 - 36.0 g/dL   RDW 15.6 (H) 11.5 - 15.5 %   Platelets 157 150 - 400 K/uL    Comment: Performed at [facility], [place] [address] [place], [place] [address]  Magnesium     Status: None   Collection Time: 05/02/18  3:38 PM  Result Value Ref Range   Magnesium 2.3 1.7 - 2.4 mg/dL    Comment: Performed at [facility], [place] [address] [place], [place] [address]  I-Stat Chem 8, ED     Status: Abnormal   Collection Time: 05/02/18  3:50 PM  Result Value Ref Range   Sodium 159 (H) 135 - 145 mmol/L    Potassium 3.2 (L) 3.5 - 5.1 mmol/L   Chloride 122 (H) 98 - 111 mmol/L   BUN 67 (H) 8 - 23 mg/dL   Creatinine, Ser 1.50 (H) 0.44 - 1.00 mg/dL   Glucose, Bld 100 (H) 70 - 99 mg/dL   Calcium, Ion 1.04 (L) 1.15 - 1.40 mmol/L   TCO2 27 22 - 32 mmol/L   Hemoglobin 15.3 (H) 12.0 - 15.0 g/dL   HCT 45.0 36.0 - 46.0 %  I-Stat CG4 Lactic Acid, ED     Status: None   Collection Time: 05/02/18  3:50 PM  Result Value Ref Range   Lactic Acid, Venous 1.72 0.5 - 1.9 mmol/L  CBG monitoring, ED     Status: Abnormal   Collection Time: 05/02/18  3:51 PM  Result Value Ref Range   Glucose-Capillary 103 (H) 70 - 99 mg/dL  Urinalysis, Routine w reflex microscopic     Status: Abnormal   Collection Time: 05/02/18  7:07 PM  Result Value Ref Range   Color, Urine AMBER (A) YELLOW    Comment: BIOCHEMICALS MAY BE AFFECTED BY COLOR   APPearance HAZY (A) CLEAR   Specific Gravity, Urine 1.021 1.005 - 1.030   pH 5.0 5.0 - 8.0   Glucose, UA NEGATIVE NEGATIVE mg/dL   Hgb urine dipstick SMALL (A) NEGATIVE   Bilirubin Urine NEGATIVE NEGATIVE   Ketones, ur 5 (A) NEGATIVE mg/dL   Protein, ur NEGATIVE NEGATIVE mg/dL   Nitrite NEGATIVE NEGATIVE   Leukocytes, UA MODERATE (A) NEGATIVE   RBC / HPF 6-10 0 - 5 RBC/hpf   WBC, UA >50 (H) 0 - 5 WBC/hpf   Bacteria, UA MANY (A) NONE SEEN   Squamous Epithelial / LPF 0-5 0 - 5   WBC Clumps PRESENT    Mucus PRESENT    Hyaline Casts, UA PRESENT     Comment: Performed at [facility], [place] [address], [place], [place] [address]   Dg Chest 2 View  Result Date: 05/02/2018 CLINICAL DATA:  Increased altered mental status over the last 2 days. EXAM: CHEST - 2 VIEW COMPARISON:  04/10/2018 FINDINGS: Opacity at the right lung base is stable from the prior exam, consistent with  atelectasis. Lungs otherwise clear. Cardiac silhouette is normal in size. No mediastinal or hilar masses. No evidence of adenopathy No pleural effusion or pneumothorax. Skeletal structures are  intact. IMPRESSION: No active cardiopulmonary disease. Electronically Signed   By: Lajean Manes M.D.   On: 05/02/2018 17:11    Pending Labs Unresulted Labs (From admission, onward)    Start     Ordered   05/09/18 0500  Creatinine, serum  (enoxaparin (LOVENOX)    CrCl >/= 30 ml/min)  Weekly,   R    Comments:  while on enoxaparin therapy    05/02/18 2004   05/03/18 9935  Basic metabolic panel  Tomorrow morning,   R     05/02/18 2004   05/03/18 0500  CBC  Tomorrow morning,   R     05/02/18 2004   05/03/18 0500  Magnesium  Tomorrow morning,   R     05/02/18 2004   05/02/18 2002  CBC  (enoxaparin (LOVENOX)    CrCl >/= 30 ml/min)  Once,   R    Comments:  Baseline for enoxaparin therapy IF NOT ALREADY DRAWN.  Notify MD if PLT < 100 K.    05/02/18 2004   05/02/18 2002  Creatinine, serum  (enoxaparin (LOVENOX)    CrCl >/= 30 ml/min)  Once,   R    Comments:  Baseline for enoxaparin therapy IF NOT ALREADY DRAWN.    05/02/18 2004   05/02/18 1951  Urine culture  STAT,   STAT     05/02/18 1950          Vitals/Pain Today's Vitals   05/02/18 1540 05/02/18 1550 05/02/18 1742 05/02/18 1745  BP: 101/66     Pulse:  90  92  Resp:  13  (!) 24  Temp:   98.5 F (36.9 C)   TempSrc:   Rectal   SpO2:  94%  100%    Isolation Precautions No active isolations  Medications Medications  potassium chloride 10 mEq in 100 mL IVPB (10 mEq Intravenous New Bag/Given 05/02/18 2000)  magnesium sulfate IVPB 2 g 50 mL (has no administration in time range)  cefTRIAXone (ROCEPHIN) 1 g in sodium chloride 0.9 % 100 mL IVPB (has no administration in time range)  sertraline (ZOLOFT) tablet 25 mg (has no administration in time range)  food thickener (THICK IT) powder (has no administration in time range)  carbidopa-levodopa (SINEMET IR) 25-100 MG per tablet immediate release 1 tablet (has no administration in time range)  entacapone (COMTAN) tablet 200 mg (has no administration in time range)  feeding  supplement (ENSURE ENLIVE) (ENSURE ENLIVE) liquid 237 mL (has no administration in time range)  multivitamin with minerals tablet 1 tablet (has no administration in time range)  acetaminophen (TYLENOL) tablet 650 mg (has no administration in time range)    Or  acetaminophen (TYLENOL) suppository 650 mg (has no administration in time range)  ondansetron (ZOFRAN) tablet 4 mg (has no administration in time range)    Or  ondansetron (ZOFRAN) injection 4 mg (has no administration in time range)  enoxaparin (LOVENOX) injection 40 mg (has no administration in time range)  dextrose 5 % solution (has no administration in time range)  cefTRIAXone (ROCEPHIN) 1 g in sodium chloride 0.9 % 100 mL IVPB (has no administration in time range)  lactated ringers bolus 1,000 mL (0 mLs Intravenous Stopped 05/02/18 1908)    Mobility non-ambulatory

## 2018-05-02 NOTE — H&P (Signed)
History and Physical    [name] ZOX:[id number] DOB: [birth year] DOA: 05/02/2018  PCP: Angela Cox, MD  Patient coming from: Skilled nursing facility.  Chief Complaint: Increasing confusion and decreased p.o. Intake.  History obtained from ER physician.  Patient is confused and patient's family is not at the bedside at this time.  HPI: Debra Pierce is a 82 y.o. female with history of Parkinson's disease and possible Lewy body dementia, depression who was recently admitted for pneumonia and confusion and eventually discharged to rehab was found to be increasingly confused over the last 3 days with decreased p.o. intake.  Per the report patient did not have any nausea vomiting or diarrhea or any chest pain or cough.  Has been increasingly somnolent.  Patient was brought to the ER.  Patient has become largely bedbound since recent kyphoplasty.  During recent admission last month L5 compression fracture was reevaluated by neurosurgery advised brace.  Presently patient is largely bedbound.  ED Course: In the ER patient was appearing confused and UA shows features consistent with UTI and labs also show sodium of 154 potassium 3.1 creatinine 1.4 which is increased from baseline.  Was given Ringer lactate bolus.  Admitted for acute encephalopathy with UTI and right imbalance.  On my exam patient appears confused and mildly tender in the abdomen diffusely.  Review of Systems: As per HPI, rest all negative.   Past Medical History:  Diagnosis Date  . Benign essential HTN   . Depression   . HLD (hyperlipidemia)   . Neuropathy   . Parkinson's disease dementia [facility])     Past Surgical History:  Procedure Laterality Date  . L5 kyphoplasty Bilateral      reports that she has never smoked. She has never used smokeless tobacco. She reports that she drank alcohol. She reports that she does not use drugs.  Allergies  Allergen Reactions  . Codeine Other (See Comments)    Family stated  unknown reaction  . Hydrocodone     History reviewed. No pertinent family history.  Prior to Admission medications   Medication Sig Start Date End Date Taking? Authorizing Provider  carbidopa-levodopa (SINEMET IR) 25-100 MG tablet Take 1 tablet by mouth 4 (four) times daily. Take with Comtan (entacapone). May crush and take with apple sauce. 04/24/18 05/24/18 Yes Melene Plan, MD  entacapone (COMTAN) 200 MG tablet Take 1 tablet (200 mg total) by mouth 4 (four) times daily. Take with sinemet (carbidopa-levodopa). May crush and take with apple sauce. 04/24/18  Yes Melene Plan, MD  feeding supplement, ENSURE ENLIVE, (ENSURE ENLIVE) LIQD Take 237 mLs by mouth 3 (three) times daily between meals. 04/21/18  Yes Leland Her, DO  Multiple Vitamins-Minerals (MULTIVITAMIN WITH MINERALS) tablet Take 1 tablet by mouth daily.   Yes [provider]  sertraline (ZOLOFT) 25 MG tablet Take 25 mg by mouth at bedtime.   Yes [provider]  Skin Protectants, Misc. (MINERIN) CREA Apply topically.   Yes [provider]  food thickener [facility]) POWD As needed 04/21/18   Leland Her, DO    Physical Exam: Vitals:   05/02/18 1540 05/02/18 1550 05/02/18 1742 05/02/18 1745  BP: 101/66     Pulse:  90  92  Resp:  13  (!) 24  Temp:   98.5 F (36.9 C)   TempSrc:   Rectal   SpO2:  94%  100%      Constitutional: Moderately built and nourished. Vitals:   05/02/18  1540 05/02/18 1550 05/02/18 1742 05/02/18 1745  BP: 101/66     Pulse:  90  92  Resp:  13  (!) 24  Temp:   98.5 F (36.9 C)   TempSrc:   Rectal   SpO2:  94%  100%   Eyes: Anicteric no pallor. ENMT: No discharge from the ears eyes nose or mouth. Neck: No mass felt.  No neck rigidity but no JVD appreciated. Respiratory: No rhonchi or crepitations. Cardiovascular: S1-S2 heard no murmurs appreciated. Abdomen: Soft nontender bowel sounds present. Musculoskeletal: No edema.  No joint effusion. Skin: No rash.  Skin  appears warm. Neurologic: Alert awake appears confused does not follow commands moves all extremities. Psychiatric: Appears confused.   Labs on Admission: I have personally reviewed following labs and imaging studies  CBC: Recent Labs  Lab 05/02/18 1538 05/02/18 1550  WBC 11.2*  --   HGB 13.6 15.3*  HCT 44.3 45.0  MCV 101.6*  --   PLT 157  --    Basic Metabolic Panel: Recent Labs  Lab 05/02/18 1538 05/02/18 1550  NA 154* 159*  K 3.1* 3.2*  CL 121* 122*  CO2 25  --   GLUCOSE 100* 100*  BUN 64* 67*  CREATININE 1.50* 1.50*  CALCIUM 7.4*  --   MG 2.3  --    GFR: Estimated Creatinine Clearance: 25 mL/min (A) (by C-G formula based on SCr of 1.5 mg/dL (H)). Liver Function Tests: Recent Labs  Lab 05/02/18 1538  AST 24  ALT 9  ALKPHOS 67  BILITOT 0.6  PROT 5.8*  ALBUMIN 2.9*   No results for input(s): LIPASE, AMYLASE in the last 168 hours. No results for input(s): AMMONIA in the last 168 hours. Coagulation Profile: No results for input(s): INR, PROTIME in the last 168 hours. Cardiac Enzymes: No results for input(s): CKTOTAL, CKMB, CKMBINDEX, TROPONINI in the last 168 hours. BNP (last 3 results) No results for input(s): PROBNP in the last 8760 hours. HbA1C: No results for input(s): HGBA1C in the last 72 hours. CBG: Recent Labs  Lab 05/02/18 1551  GLUCAP 103*   Lipid Profile: No results for input(s): CHOL, HDL, LDLCALC, TRIG, CHOLHDL, LDLDIRECT in the last 72 hours. Thyroid Function Tests: No results for input(s): TSH, T4TOTAL, FREET4, T3FREE, THYROIDAB in the last 72 hours. Anemia Panel: No results for input(s): VITAMINB12, FOLATE, FERRITIN, TIBC, IRON, RETICCTPCT in the last 72 hours. Urine analysis:    Component Value Date/Time   COLORURINE AMBER (A) 05/02/2018 1907   APPEARANCEUR HAZY (A) 05/02/2018 1907   LABSPEC 1.021 05/02/2018 1907   PHURINE 5.0 05/02/2018 1907   GLUCOSEU NEGATIVE 05/02/2018 1907   HGBUR SMALL (A) 05/02/2018 1907   BILIRUBINUR  NEGATIVE 05/02/2018 1907   KETONESUR 5 (A) 05/02/2018 1907   PROTEINUR NEGATIVE 05/02/2018 1907   NITRITE NEGATIVE 05/02/2018 1907   LEUKOCYTESUR MODERATE (A) 05/02/2018 1907   Sepsis Labs: @LABRCNTIP (procalcitonin:4,lacticidven:4) )No results found for this or any previous visit (from the past 240 hour(s)).   Radiological Exams on Admission: Dg Chest 2 View  Result Date: 05/02/2018 CLINICAL DATA:  Increased altered mental status over the last 2 days. EXAM: CHEST - 2 VIEW COMPARISON:  04/10/2018 FINDINGS: Opacity at the right lung base is stable from the prior exam, consistent with atelectasis. Lungs otherwise clear. Cardiac silhouette is normal in size. No mediastinal or hilar masses. No evidence of adenopathy No pleural effusion or pneumothorax. Skeletal structures are intact. IMPRESSION: No active cardiopulmonary disease. Electronically Signed   By: Onalee Hua  Ormond M.D.   On: 05/02/2018 17:11    EKG: Independently reviewed.  Normal sinus rhythm with LBBB.  Has LBBB during last admission EKG.  Assessment/Plan Principal Problem:   Acute encephalopathy Active Problems:   Dementia due to Parkinson's disease without behavioral disturbance (HCC)   ARF (acute renal failure) (HCC)   Hypernatremia   Hypokalemia   Acute lower UTI    1. Acute encephalopathy likely from UTI and electrolyte changes with acute renal failure -has been placed on ceftriaxone.  Follow urine cultures.  Patient is placed on D5W for the hypernatremia and renal failure.  Patient was given potassium IV for potassium replacement.  Since on exam patient abdomen appears mildly tender I have ordered CT abdomen pelvis.  If mental status does not improve may consider neurology evaluation for her Parkinson's and Lewy body dementia. 2. UTI -see #1. 3. Hypernatremia -patient received 1 L fluid bolus of Ringer lactate in the ER I have placed patient on D5W closely follow metabolic panel. 4. Hypokalemia likely from poor oral intake  has received IV potassium. 5. History of dysphagia on dysphagia 1 diet.  As per the patient's nurse patient was able to swallow her pills. 6. Parkinson's disease and Lewy body dementia on Sinemet and entacapone.  Which will be continued. 7. History of depression on Zoloft. 8. Macrocytosis without anemia. 9. LBBB appears to be chronic.   DVT prophylaxis: Lovenox. Code Status: Full code. Family Communication: No family at the bedside. Disposition Plan: Back to skilled nursing facility. Consults called: None. Admission status: Observation.   Eduard Clos MD [facility] Pager [phone].  If 7PM-7AM, please contact night-coverage [email or web address] Password [email or web address]  05/02/2018, 8:05 PM

## 2018-05-03 DIAGNOSIS — N390 Urinary tract infection, site not specified: Secondary | ICD-10-CM | POA: Diagnosis not present

## 2018-05-03 DIAGNOSIS — G20 Parkinson's disease: Secondary | ICD-10-CM | POA: Diagnosis not present

## 2018-05-03 DIAGNOSIS — I447 Left bundle-branch block, unspecified: Secondary | ICD-10-CM | POA: Diagnosis present

## 2018-05-03 DIAGNOSIS — E870 Hyperosmolality and hypernatremia: Secondary | ICD-10-CM | POA: Diagnosis present

## 2018-05-03 DIAGNOSIS — Z79899 Other long term (current) drug therapy: Secondary | ICD-10-CM | POA: Diagnosis not present

## 2018-05-03 DIAGNOSIS — Z7401 Bed confinement status: Secondary | ICD-10-CM | POA: Diagnosis not present

## 2018-05-03 DIAGNOSIS — Z993 Dependence on wheelchair: Secondary | ICD-10-CM | POA: Diagnosis not present

## 2018-05-03 DIAGNOSIS — G9340 Encephalopathy, unspecified: Secondary | ICD-10-CM | POA: Diagnosis not present

## 2018-05-03 DIAGNOSIS — I10 Essential (primary) hypertension: Secondary | ICD-10-CM | POA: Diagnosis present

## 2018-05-03 DIAGNOSIS — E860 Dehydration: Secondary | ICD-10-CM | POA: Diagnosis present

## 2018-05-03 DIAGNOSIS — F0280 Dementia in other diseases classified elsewhere without behavioral disturbance: Secondary | ICD-10-CM | POA: Diagnosis present

## 2018-05-03 DIAGNOSIS — Z7189 Other specified counseling: Secondary | ICD-10-CM | POA: Diagnosis not present

## 2018-05-03 DIAGNOSIS — N179 Acute kidney failure, unspecified: Secondary | ICD-10-CM | POA: Diagnosis present

## 2018-05-03 DIAGNOSIS — Z66 Do not resuscitate: Secondary | ICD-10-CM | POA: Diagnosis present

## 2018-05-03 DIAGNOSIS — Z515 Encounter for palliative care: Secondary | ICD-10-CM | POA: Diagnosis not present

## 2018-05-03 DIAGNOSIS — E876 Hypokalemia: Secondary | ICD-10-CM | POA: Diagnosis present

## 2018-05-03 DIAGNOSIS — G3183 Dementia with Lewy bodies: Secondary | ICD-10-CM | POA: Diagnosis present

## 2018-05-03 DIAGNOSIS — A0472 Enterocolitis due to Clostridium difficile, not specified as recurrent: Secondary | ICD-10-CM | POA: Diagnosis not present

## 2018-05-03 DIAGNOSIS — K219 Gastro-esophageal reflux disease without esophagitis: Secondary | ICD-10-CM | POA: Diagnosis present

## 2018-05-03 DIAGNOSIS — R4182 Altered mental status, unspecified: Secondary | ICD-10-CM | POA: Diagnosis present

## 2018-05-03 DIAGNOSIS — G9341 Metabolic encephalopathy: Secondary | ICD-10-CM | POA: Diagnosis present

## 2018-05-03 DIAGNOSIS — E785 Hyperlipidemia, unspecified: Secondary | ICD-10-CM | POA: Diagnosis present

## 2018-05-03 DIAGNOSIS — N3000 Acute cystitis without hematuria: Secondary | ICD-10-CM | POA: Diagnosis present

## 2018-05-03 LAB — BASIC METABOLIC PANEL
ANION GAP: 6 (ref 5–15)
ANION GAP: 12 (ref 5–15)
Anion gap: 9 (ref 5–15)
Anion gap: 11 (ref 5–15)
BUN: 61 mg/dL — AB (ref 8–23)
BUN: 54 mg/dL — AB (ref 8–23)
BUN: 50 mg/dL — AB (ref 8–23)
BUN: 42 mg/dL — ABNORMAL HIGH (ref 8–23)
CALCIUM: 8.5 mg/dL — AB (ref 8.9–10.3)
CALCIUM: 8 mg/dL — AB (ref 8.9–10.3)
CHLORIDE: 129 mmol/L — AB (ref 98–111)
CHLORIDE: 126 mmol/L — AB (ref 98–111)
CO2: 28 mmol/L (ref 22–32)
CO2: 26 mmol/L (ref 22–32)
CO2: 27 mmol/L (ref 22–32)
CO2: 22 mmol/L (ref 22–32)
CREATININE: 1.58 mg/dL — AB (ref 0.44–1.00)
CREATININE: 1.42 mg/dL — AB (ref 0.44–1.00)
Calcium: 8.5 mg/dL — ABNORMAL LOW (ref 8.9–10.3)
Calcium: 8.3 mg/dL — ABNORMAL LOW (ref 8.9–10.3)
Chloride: 126 mmol/L — ABNORMAL HIGH (ref 98–111)
Chloride: 128 mmol/L — ABNORMAL HIGH (ref 98–111)
Creatinine, Ser: 1.29 mg/dL — ABNORMAL HIGH (ref 0.44–1.00)
Creatinine, Ser: 1.2 mg/dL — ABNORMAL HIGH (ref 0.44–1.00)
GFR calc Af Amer: 35 mL/min — ABNORMAL LOW (ref >60)
GFR calc Af Amer: 39 mL/min — ABNORMAL LOW (ref >60)
GFR calc Af Amer: 44 mL/min — ABNORMAL LOW (ref >60)
GFR calc Af Amer: 48 mL/min — ABNORMAL LOW (ref >60)
GFR calc non Af Amer: 30 mL/min — ABNORMAL LOW (ref >60)
GFR calc non Af Amer: 38 mL/min — ABNORMAL LOW (ref >60)
GFR calc non Af Amer: 42 mL/min — ABNORMAL LOW (ref >60)
GFR, EST NON AFRICAN AMERICAN: 34 mL/min — AB (ref >60)
GLUCOSE: 115 mg/dL — AB (ref 70–99)
GLUCOSE: 117 mg/dL — AB (ref 70–99)
GLUCOSE: 132 mg/dL — AB (ref 70–99)
Glucose, Bld: 119 mg/dL — ABNORMAL HIGH (ref 70–99)
Potassium: 3.8 mmol/L (ref 3.5–5.1)
Potassium: 4.1 mmol/L (ref 3.5–5.1)
Potassium: 3.6 mmol/L (ref 3.5–5.1)
Potassium: 4.1 mmol/L (ref 3.5–5.1)
Sodium: 166 mmol/L (ref 135–145)
Sodium: 163 mmol/L (ref 135–145)
Sodium: 161 mmol/L (ref 135–145)
Sodium: 160 mmol/L — ABNORMAL HIGH (ref 135–145)

## 2018-05-03 LAB — CBC
HCT: 44.6 % (ref 36.0–46.0)
Hemoglobin: 13.8 g/dL (ref 12.0–15.0)
MCH: 31.1 pg (ref 26.0–34.0)
MCHC: 30.9 g/dL (ref 30.0–36.0)
MCV: 100.5 fL — AB (ref 78.0–100.0)
PLATELETS: 167 10*3/uL (ref 150–400)
RBC: 4.44 MIL/uL (ref 3.87–5.11)
RDW: 15.4 % (ref 11.5–15.5)
WBC: 10.5 10*3/uL (ref 4.0–10.5)

## 2018-05-03 LAB — MAGNESIUM: MAGNESIUM: 2.4 mg/dL (ref 1.7–2.4)

## 2018-05-03 MED ORDER — SODIUM CHLORIDE 0.45 % IV BOLUS
1000.0000 mL | Freq: Once | INTRAVENOUS | Status: AC
  Administered 2018-05-03: 1000 mL via INTRAVENOUS

## 2018-05-03 NOTE — Progress Notes (Signed)
PALLIATIVE CARE CONSULT VISIT   PATIENT NAME: Debra Pierce DOB: [birth year] MRN: [id number]  PRIMARY CARE PROVIDER:   Angela Cox, MD  REFERRING PROVIDER:  Dr Jethro Bastos RESPONSIBLE PARTY:   Lastacia Solum) [phone]    RECOMMENDATIONS and PLAN:  1. Memory loss due to medical condition  R41.3:  Advanced.  FAST stage 7c with behavioral disturbances.  Associated with Parkinson disease. Plan on discussion with son related to goals of care.   2.  Palliative care encounter Z51.5:  No family present at time of visit.  Currently full code.  Plan on addressing ACP and goals of care with son.  Pt. Appears to be in a state of decline with advanced dementia and resolving pneumonia.  Message left for son Arlys John to call this NP.  Palliative care will continue to follow.    I spent 20 minutes providing this consultation,  from 12:00pm to 12:20pm at [facility]. More than 50% of the time in this consultation was spent coordinating communication with facility staff.Marland Kitchen   HISTORY OF PRESENT ILLNESS:  Debra Pierce is a 82 y.o. year old female with multiple medical problems including Parkinson with Lewy Body dementia, increased weakness and hospitalization from 9/9-9/23/19 due to altered mental status and pneumonia.  Staff reports that she requires total assistance with all ADLs, feeding and is non-ambulatory.  She is incontinent of B&B. She is unable to communicate her needs.  Palliative Care was asked to help address goals of care and advanced care planning.   CODE STATUS:  FULL CODE  PPS: 30%  Up in chair for meals HOSPICE ELIGIBILITY/DIAGNOSIS: TBD  PAST MEDICAL HISTORY:  Past Medical History:  Diagnosis Date  . Benign essential HTN   . Depression   . HLD (hyperlipidemia)   . Neuropathy   . Parkinson's disease dementia (HCC)     SOCIAL HX:  Social History   Tobacco Use  . Smoking status: Never Smoker  . Smokeless tobacco: Never Used  Substance Use Topics  . Alcohol use: Not  Currently    ALLERGIES:  Allergies  Allergen Reactions  . Codeine Other (See Comments)    Family stated unknown reaction  . Hydrocodone      PERTINENT MEDICATIONS:  No facility-administered encounter medications on file as of 04/25/2018.    Outpatient Encounter Medications as of 04/25/2018  Medication Sig  . carbidopa-levodopa (SINEMET IR) 25-100 MG tablet Take 1 tablet by mouth 4 (four) times daily. Take with Comtan (entacapone). May crush and take with apple sauce. (Patient taking differently: Take 1 tablet by mouth 4 (four) times daily. Take with Comtan (entacapone). May crush and take with apple sauce. 0000, 0600, 1200, 1800)  . entacapone (COMTAN) 200 MG tablet Take 1 tablet (200 mg total) by mouth 4 (four) times daily. Take with sinemet (carbidopa-levodopa). May crush and take with apple sauce. (Patient taking differently: Take 200 mg by mouth 4 (four) times daily. Take with sinemet (carbidopa-levodopa). May crush and take with apple sauce. 0000, 0600, 1200, 1800)  . feeding supplement, ENSURE ENLIVE, (ENSURE ENLIVE) LIQD Take 237 mLs by mouth 3 (three) times daily between meals.  . food thickener (THICK IT) POWD As needed  . Multiple Vitamins-Minerals (MULTIVITAMIN WITH MINERALS) tablet Take 1 tablet by mouth daily.  . sertraline (ZOLOFT) 25 MG tablet Take 25 mg by mouth at bedtime.    PHYSICAL EXAM:   General: Chronically ill, frail and fragile appearing elderly female sitting in wheelchair at nurse's station, thin.  In NAD Cardiovascular:  regular rate and rhythm Pulmonary: clear ant fields Abdomen: soft, nontender, + bowel sounds GU: no suprapubic tenderness Extremities: no edema or ecchymosis.  Attempting to get out of wheelchair often Skin: exposed skin is intact Neurological: Alert but unable to assess awareness due to cognitive deficit. Speech is jibberish.  Weakness Psych:  Reaching for inanimate objects.  All physical objects are placed in mouth by pt.  Agitated mood  intermittently    Margaretha Sheffield, NP-C

## 2018-05-03 NOTE — Progress Notes (Signed)
CRITICAL VALUE ALERT  Critical Value:  161   Date & Time Notied:  05/03/18 1600  Provider Notified: Dr. Edward Jolly via text page  Orders Received/Actions taken:  Maeola Harman

## 2018-05-03 NOTE — Progress Notes (Signed)
CRITICAL VALUE ALERT  Critical Value:  NA 166  Date & Time Notied:  05/03/2018 6:19 AM   Provider Notified: NP Craige Cotta  Orders Received/Actions taken: None at this time.

## 2018-05-03 NOTE — Progress Notes (Signed)
At 0930 notified of critical Na level of 163.  Dr. Edward Jolly made aware on bedside rounds. No orders received. Maeola Harman

## 2018-05-03 NOTE — Progress Notes (Addendum)
PROGRESS NOTE Debra Pierce   QIO:[id number] DOB: 1936-04-07  DOA: 05/02/2018 PCP: Angela Cox, MD   Brief Narrative:  Debra Pierce is a 82 year old female with history of severe Parkinson disease/Lewy body dementia, depression and hypertension who was sent from skilled nursing facility after found to be increasingly confused with decreased p.o. intake.  Upon ED evaluation patient appeared to be confused, UA showed features consistent of UTI, elevated sodium at 154, creatinine 1.4 and K 3.1.  Patient was admitted with working diagnosis of acute encephalopathy due to UTI and complicated with hypernatremia.  Subjective: Patient seen and examined, she is very confused however family members are present in feel that she is more awake and responsive this morning.  Sodium trending down.  Blood pressure was soft this morning.  Assessment & Plan: Acute metabolic encephalopathy Multifactorial from UTI and dehydration/hypernatremia in setting of severe Parkinson's/Lewy body dementia.  Per family baseline with severe dementia however speaks and can carry very small conversation.  Currently minimally verbal.  Treat underlying causes.  UTI UA grossly abnormal, patient on empiric antibiotic with Rocephin.  Will continue antibiotics and follow urine cultures.  De-escalate antibiotic therapy pending results.  Hypernatremia Likely due to severe dehydration and poor oral intake. On D5W, continue to monitor sodium every 4 hours.  Do not correct sodium more than 10 mEq a day.    AKI From poor oral intake, creatinine improving with IV fluids.  Continue hydration.  Avoid nephrotoxic agents and hypotension.  Check BMP in a.m.  History of dysphagia Chest x-ray with no signs of acute pulmonary disease.  Continue dysphagia 1 diet.  Parkinson's/Lewy body dementia Will hold Sinemet and entacapone for now.  May not need this medication given severe condition.  Continue to  monitor  Hypokalemia  Replete Check BMP in a.m.   DVT prophylaxis: Lovenox Code Status: Full code Family Communication: Family at bedside Disposition Plan: SNF when clinically stable  Consultants:   None  Procedures:   None  Antimicrobials:  Rocephin 10/1   Objective: Vitals:   05/03/18 1000 05/03/18 1401 05/03/18 1415 05/03/18 1551  BP:   (!) 82/62 128/69  Pulse:  84    Resp:  18    Temp:  98.7 F (37.1 C)    TempSrc:  Oral    SpO2:      Weight:      Height: 5\' 1"  (1.549 m)       Intake/Output Summary (Last 24 hours) at 05/03/2018 1803 Last data filed at 05/03/2018 1630 Gross per 24 hour  Intake 3377.51 ml  Output -  Net 3377.51 ml   Filed Weights   05/03/18 0510  Weight: 63.8 kg    Examination:  General exam: NAD HEENT: OP moist and clear Respiratory system: Clear to auscultation. No wheezes,crackle or rhonchi Cardiovascular system: S1 & S2 heard, RRR. No JVD, murmurs, rubs or gallops Gastrointestinal system: Abdomen is nondistended, soft and nontender.  Central nervous system: Confused, minimally verbal.  Follows simple commands. Extremities: No pedal edema. Skin: No rashes Psychiatry: Unable to evaluate  Data Reviewed: I have personally reviewed following labs and imaging studies  CBC: Recent Labs  Lab 05/02/18 1538 05/02/18 1550 05/03/18 0447  WBC 11.2*  --  10.5  HGB 13.6 15.3* 13.8  HCT 44.3 45.0 44.6  MCV 101.6*  --  100.5*  PLT 157  --  167   Basic Metabolic Panel: Recent Labs  Lab 05/02/18 1538 05/02/18 1550 05/03/18 0447 05/03/18 0915 05/03/18  1501  NA 154* 159* 166* 163* 161*  K 3.1* 3.2* 3.8 4.1 3.6  CL 121* 122* 129* 126* 128*  CO2 25  --  28 26 27   GLUCOSE 100* 100* 119* 115* 117*  BUN 64* 67* 61* 54* 50*  CREATININE 1.50* 1.50* 1.58* 1.42* 1.29*  CALCIUM 7.4*  --  8.5* 8.5* 8.0*  MG 2.3  --  2.4  --   --    GFR: Estimated Creatinine Clearance: 29.8 mL/min (A) (by C-G formula based on SCr of 1.29 mg/dL  (H)). Liver Function Tests: Recent Labs  Lab 05/02/18 1538  AST 24  ALT 9  ALKPHOS 67  BILITOT 0.6  PROT 5.8*  ALBUMIN 2.9*   No results for input(s): LIPASE, AMYLASE in the last 168 hours. No results for input(s): AMMONIA in the last 168 hours. Coagulation Profile: No results for input(s): INR, PROTIME in the last 168 hours. Cardiac Enzymes: No results for input(s): CKTOTAL, CKMB, CKMBINDEX, TROPONINI in the last 168 hours. BNP (last 3 results) No results for input(s): PROBNP in the last 8760 hours. HbA1C: No results for input(s): HGBA1C in the last 72 hours. CBG: Recent Labs  Lab 05/02/18 1551  GLUCAP 103*   Lipid Profile: No results for input(s): CHOL, HDL, LDLCALC, TRIG, CHOLHDL, LDLDIRECT in the last 72 hours. Thyroid Function Tests: No results for input(s): TSH, T4TOTAL, FREET4, T3FREE, THYROIDAB in the last 72 hours. Anemia Panel: No results for input(s): VITAMINB12, FOLATE, FERRITIN, TIBC, IRON, RETICCTPCT in the last 72 hours. Sepsis Labs: Recent Labs  Lab 05/02/18 1550  LATICACIDVEN 1.72    No results found for this or any previous visit (from the past 240 hour(s)).    Radiology Studies: Ct Abdomen Pelvis Wo Contrast  Result Date: 05/02/2018 CLINICAL DATA:  Abdominal distension. EXAM: CT ABDOMEN AND PELVIS WITHOUT CONTRAST TECHNIQUE: Multidetector CT imaging of the abdomen and pelvis was performed following the standard protocol without IV contrast. COMPARISON:  Radiograph 04/10/2018 FINDINGS: Lower chest: Linear atelectasis in both lower lobes. Heart is normal in size. Coronary artery calcifications. Hepatobiliary: No focal hepatic lesion allowing for lack contrast. Mild gallbladder distention without wall thickening or pericholecystic inflammation. No calcified gallstone. No biliary dilatation. Pancreas: Parenchymal atrophy. No ductal dilatation or inflammation. Spleen: Normal in size without focal abnormality. Adrenals/Urinary Tract: No adrenal nodule. No  hydronephrosis or perinephric edema. No urolithiasis. Small exophytic cyst from the lower right kidney. Ureters are decompressed. Urinary bladder is physiologically distended. Stomach/Bowel: Small to moderate hiatal hernia. Stomach is otherwise nondistended. No bowel obstruction. No evidence of bowel wall thickening or inflammatory change. The appendix is normal. Colonic diverticulosis without diverticulitis. There is transverse colonic tortuosity. Colonic interposition of the hepatic flexure lateral to the liver. Vascular/Lymphatic: Aorto bi-iliac atherosclerosis. No enlarged abdominal or pelvic lymph nodes. Reproductive: Few coarse uterine calcifications may be vascular or fibroids. No adnexal mass. Other: No free air, free fluid, or intra-abdominal fluid collection. Musculoskeletal: Kyphoplasty within L5 compression fracture. Unchanged L4 compression fracture compared to recent lumbar spine MRI. There are no acute or suspicious osseous abnormalities. IMPRESSION: 1. No acute findings or explanation for abdominal distention. 2. Colonic diverticulosis without diverticulitis. Small to moderate hiatal hernia. 3.  Aortic Atherosclerosis (ICD10-I70.0). Electronically Signed   By: Narda Rutherford M.D.   On: 05/02/2018 23:00   Dg Chest 2 View  Result Date: 05/02/2018 CLINICAL DATA:  Increased altered mental status over the last 2 days. EXAM: CHEST - 2 VIEW COMPARISON:  04/10/2018 FINDINGS: Opacity at the right lung base  is stable from the prior exam, consistent with atelectasis. Lungs otherwise clear. Cardiac silhouette is normal in size. No mediastinal or hilar masses. No evidence of adenopathy No pleural effusion or pneumothorax. Skeletal structures are intact. IMPRESSION: No active cardiopulmonary disease. Electronically Signed   By: Amie Portland M.D.   On: 05/02/2018 17:11      Scheduled Meds: . carbidopa-levodopa  1 tablet Oral Q6H  . enoxaparin (LOVENOX) injection  30 mg Subcutaneous Q24H  . entacapone   200 mg Oral Q6H  . feeding supplement (ENSURE ENLIVE)  237 mL Oral TID BM  . multivitamin with minerals  1 tablet Oral Daily  . sertraline  25 mg Oral QHS   Continuous Infusions: . cefTRIAXone (ROCEPHIN)  IV    . dextrose 125 mL/hr at 05/03/18 1548  . magnesium sulfate       LOS: 0 days    Time spent: Total of 25 minutes spent with pt, greater than 50% of which was spent in discussion of  treatment, counseling and coordination of care  Latrelle Dodrill, MD Pager: Text Page via [email or web address]   If 7PM-7AM, please contact night-coverage [email or web address] 05/03/2018, 6:03 PM   Note - This record has been created using [employer]. Chart creation errors have been sought, but may not always have been located. Such creation errors do not reflect on the standard of medical care.

## 2018-05-04 DIAGNOSIS — E876 Hypokalemia: Secondary | ICD-10-CM

## 2018-05-04 LAB — SODIUM
SODIUM: 157 mmol/L — AB (ref 135–145)
Sodium: 157 mmol/L — ABNORMAL HIGH (ref 135–145)
Sodium: 154 mmol/L — ABNORMAL HIGH (ref 135–145)

## 2018-05-04 LAB — URINE CULTURE: Culture: NO GROWTH

## 2018-05-04 MED ORDER — DEXTROSE 5 % IV SOLN
INTRAVENOUS | Status: DC
  Administered 2018-05-04 – 2018-05-06 (×5): via INTRAVENOUS

## 2018-05-04 NOTE — Evaluation (Signed)
Clinical/Bedside Swallow Evaluation Patient Details  Name: Debra Pierce MRN: [id number] Date of Birth: 22-May-1936  Today's Date: 05/04/2018 Time: SLP Start Time (ACUTE ONLY): 1045 SLP Stop Time (ACUTE ONLY): 1130 SLP Time Calculation (min) (ACUTE ONLY): 45 min  Past Medical History:  Past Medical History:  Diagnosis Date  . Benign essential HTN   . Depression   . HLD (hyperlipidemia)   . Neuropathy   . Parkinson's disease dementia [facility])    Past Surgical History:  Past Surgical History:  Procedure Laterality Date  . L5 kyphoplasty Bilateral    HPI:  82 year old female admitted 05/02/18 from SNF with confusion and decreased po intake; found to have UTI. PMH: recent admit - DC'd 04/24/18. severe Parkinson's disease, Lewy body dementia, HTN, HLD, depression, neuropathy, GERD. CXR = no active disease.    Assessment / Plan / Recommendation Clinical Impression  Pt known to ST services, having been seen during most recent hospitalization (DC'd 04/24/18). Pt had had MBS during that admission, which recommended pureed solids and nectar thick liquids.   Upon arrival of SLP today, pt was alert, mumbling unintelligibly. Caked material was noted on dental surface, tongue was dry, and blood was noted on inside upper left cheek. Pt was minimally cooperative with oral care. Orange tinged liquid and blood tinged secretions suctioned from oral cavity. Moisturizer applied to lips. Further visualization of pt's oral cavity is highly encouraged, as it does not look "normal" or healthy.    Pt was given small boluses of puree, which required additional time to be swallowed. Diffuse oral residue noted. No overt s/s aspiration. RN reported pt is refusing po intake, which may be due to continued progression of Parkinson's disease and dementia. At this time, will continue conservative diet of puree and nectar thick liquids, crushed meds, and oral care with suction before and after po intake. Pt appropriateness and  willingness to take po is anticipated to continue to be highly variable.   Also recommend consult with Palliative Care to facilitate establishment of appropriate goals of care for this lady. Placement of PEG tube is not recommended, as it neither insures adequate nutrition/hydration, nor eliminates risk of aspiration. SLP will follow for education once GOC are established. MD and RN notified of ST recommendations.    SLP Visit Diagnosis: Dysphagia, oropharyngeal phase (R13.12)    Aspiration Risk  Moderate aspiration risk    Diet Recommendation Nectar-thick liquid;Dysphagia 1 (Puree)   Liquid Administration via: Cup;Spoon Medication Administration: Crushed with puree Supervision: Full supervision/cueing for compensatory strategies Compensations: Minimize environmental distractions;Small sips/bites;Slow rate;Monitor for anterior loss Postural Changes: Seated upright at 90 degrees;Remain upright for at least 30 minutes after po intake    Other  Recommendations Recommended Consults: (Palliative Care) Oral Care Recommendations: Oral care before and after PO Other Recommendations: Have oral suction available;Order thickener from pharmacy   Follow up Recommendations Skilled Nursing facility      Frequency and Duration min 1 x/week  1 week;2 weeks       Prognosis Prognosis for Safe Diet Advancement: Guarded Barriers to Reach Goals: Cognitive deficits;Severity of deficits      Swallow Study   General Date of Onset: 05/02/18 HPI: 82 year old female admitted 05/02/18 from SNF with confusion and decreased po intake; found to have UTI. PMH: recent admit - DC'd 04/24/18. severe Parkinson's disease, Lewy body dementia, HTN, HLD, depression, neuropathy, GERD. CXR = no active disease.  Type of Study: Bedside Swallow Evaluation Previous Swallow Assessment: MBS 04/20/18 - silent penetration  thins. Dys1/NTL, crush meds, check for pocketing, 1:1 assist Diet Prior to this Study: Dysphagia 1  (puree);Thin liquids Temperature Spikes Noted: No Respiratory Status: Room air History of Recent Intubation: No Behavior/Cognition: Alert;Confused;Agitated;Doesn't follow directions;Requires cueing;Uncooperative Oral Cavity Assessment: Dry(caked material on dental surface, orange residue throughout oral cavity, appearance of something bloody on interior left posterior maxilla. Pt miniminally cooperative with oral care.) Oral Care Completed by SLP: Yes Oral Cavity - Dentition: Adequate natural dentition;Missing dentition Self-Feeding Abilities: Total assist Patient Positioning: Upright in bed Baseline Vocal Quality: Low vocal intensity Volitional Cough: Cognitively unable to elicit Volitional Swallow: Unable to elicit    Oral/Motor/Sensory Function Overall Oral Motor/Sensory Function: Generalized oral weakness   Ice Chips Ice chips: Not tested   Thin Liquid Thin Liquid: Not tested    Nectar Thick Nectar Thick Liquid: Not tested   Honey Thick Honey Thick Liquid: Not tested   Puree Puree: Impaired Oral Phase Impairments: Poor awareness of bolus;Impaired mastication;Reduced lingual movement/coordination;Reduced labial seal Oral Phase Functional Implications: Right anterior spillage;Oral residue;Oral holding Pharyngeal Phase Impairments: Suspected delayed Swallow   Solid     Solid: Not tested     [name], [facility], CCC-SLP Speech Language Pathologist [phone]  Leigh Aurora 05/04/2018,11:47 AM

## 2018-05-04 NOTE — Progress Notes (Signed)
PROGRESS NOTE [facility] [facility]   ZOX:[id number] DOB: 04-03-1936  DOA: 05/02/2018 PCP: Angela Cox, MD   Brief Narrative:  Debra Pierce is a 82 year old female with history of severe Parkinson disease/Lewy body dementia, depression and hypertension who was sent from skilled nursing facility after found to be increasingly confused with decreased p.o. intake.  Upon ED evaluation patient appeared to be confused, UA showed features consistent of UTI, elevated sodium at 154, creatinine 1.4 and K 3.1.  Patient was admitted with working diagnosis of acute encephalopathy due to UTI and complicated with hypernatremia.  Subjective: Patient seen and examined, she is very sleepy and lethargic today.  Not very responsive.  No acute events overnight.  Remains afebrile.  Sodium trending down.  Not eating.  Assessment & Plan: Acute metabolic encephalopathy - not improving, remains lethargic Multifactorial from UTI and dehydration/hypernatremia in setting of severe Parkinson's/Lewy body dementia.  Per family baseline with severe dementia however speaks and can carry very small conversation.  Continue to treat underlying causes.  UTI UA grossly abnormal, patient on empiric antibiotic with Rocephin.  Urine cultures with no growth, however unclear if sample was obtained after antibiotics.  Continue Rocephin for now.  Hypernatremia Due to severe dehydration and poor oral intake.  Continue D5W.  Sodium slowly improving.  AKI From poor oral intake, creatinine continues to improve with IV fluids.  Continue hydration.  Avoid nephrotoxic agents and hypotension.  Monitor BMP in a.m.  History of dysphagia SLP evaluation appreciated, patient with moderate risk for aspiration, however difficulty with swallowing.  Currently nutrition is a concern as patient is not eating.  I have discussed with some alternative ways of nutrition, son and family will not like to have any artificial nutrition at this  time.  I have discussed that PEG tube will not solve issues as it does not eliminate risk of aspiration.  We will continue IV fluids for now and evaluate in next 24 hours.  Parkinson's/Lewy body dementia Continue to hold Sinemet and entacapone.  Do not see any benefit on this medication at this point.    Hypokalemia  Replete as needed, check BMP  Goals of care Have discussed with son current condition and possible prognosis.  Son told me that he does not want any artificial ventilation or resuscitation, therefore patient has been made DNR.  Discussed ways of nutrition, for which I do not recommend any artificial ways of nutrition at this point.  Patient continues to decline due to recent pneumonia in view of severe Parkinson's/Lewy body dementia.  Subsequently get UTI, dehydration and hypernatremia which furthermore deteriorated mental status.  Patient will be unlikely to recover from this episode.  With poor nutrition and declining mental status patient might qualify for hospice.  Palliative care has been consulted family in agreement.   DVT prophylaxis: Lovenox Code Status: Full code Family Communication: Family at bedside Disposition Plan: Possible hospice  Consultants:   None  Procedures:   None  Antimicrobials:  Rocephin 10/1   Objective: Vitals:   05/03/18 2209 05/04/18 0500 05/04/18 0651 05/04/18 1407  BP: (!) 112/91  (!) 107/92 135/66  Pulse: 85  83 (!) 115  Resp: 20  18 14   Temp: 98.8 F (37.1 C)  98.1 F (36.7 C) 98.3 F (36.8 C)  TempSrc: Oral  Oral Oral  SpO2: 100%  98% 96%  Weight:  59 kg    Height:        Intake/Output Summary (Last 24 hours) at 05/04/2018  1646 Last data filed at 05/04/2018 1400 Gross per 24 hour  Intake 843.15 ml  Output -  Net 843.15 ml   Filed Weights   05/03/18 0510 05/04/18 0500  Weight: 63.8 kg 59 kg    Examination:  General: Lethargic Cardiovascular: RRR,  Respiratory: CTA bilaterally, no wheezing, no  rhonchi Abdominal: Soft, NT, ND, bowel sounds + Neurology: Lethargic, not following commands, blank staring Extremities: no edema  Data Reviewed: I have personally reviewed following labs and imaging studies  CBC: Recent Labs  Lab 05/02/18 1538 05/02/18 1550 05/03/18 0447  WBC 11.2*  --  10.5  HGB 13.6 15.3* 13.8  HCT 44.3 45.0 44.6  MCV 101.6*  --  100.5*  PLT 157  --  167   Basic Metabolic Panel: Recent Labs  Lab 05/02/18 1538 05/02/18 1550 05/03/18 0447 05/03/18 0915 05/03/18 1501 05/03/18 1854 05/04/18 1101  NA 154* 159* 166* 163* 161* 160* 157*  K 3.1* 3.2* 3.8 4.1 3.6 4.1  --   CL 121* 122* 129* 126* 128* 126*  --   CO2 25  --  28 26 27 22   --   GLUCOSE 100* 100* 119* 115* 117* 132*  --   BUN 64* 67* 61* 54* 50* 42*  --   CREATININE 1.50* 1.50* 1.58* 1.42* 1.29* 1.20*  --   CALCIUM 7.4*  --  8.5* 8.5* 8.0* 8.3*  --   MG 2.3  --  2.4  --   --   --   --    GFR: Estimated Creatinine Clearance: 30.9 mL/min (A) (by C-G formula based on SCr of 1.2 mg/dL (H)). Liver Function Tests: Recent Labs  Lab 05/02/18 1538  AST 24  ALT 9  ALKPHOS 67  BILITOT 0.6  PROT 5.8*  ALBUMIN 2.9*   No results for input(s): LIPASE, AMYLASE in the last 168 hours. No results for input(s): AMMONIA in the last 168 hours. Coagulation Profile: No results for input(s): INR, PROTIME in the last 168 hours. Cardiac Enzymes: No results for input(s): CKTOTAL, CKMB, CKMBINDEX, TROPONINI in the last 168 hours. BNP (last 3 results) No results for input(s): PROBNP in the last 8760 hours. HbA1C: No results for input(s): HGBA1C in the last 72 hours. CBG: Recent Labs  Lab 05/02/18 1551  GLUCAP 103*   Lipid Profile: No results for input(s): CHOL, HDL, LDLCALC, TRIG, CHOLHDL, LDLDIRECT in the last 72 hours. Thyroid Function Tests: No results for input(s): TSH, T4TOTAL, FREET4, T3FREE, THYROIDAB in the last 72 hours. Anemia Panel: No results for input(s): VITAMINB12, FOLATE, FERRITIN,  TIBC, IRON, RETICCTPCT in the last 72 hours. Sepsis Labs: Recent Labs  Lab 05/02/18 1550  LATICACIDVEN 1.72    Recent Results (from the past 240 hour(s))  Urine culture     Status: None   Collection Time: 05/02/18  7:07 PM  Result Value Ref Range Status   Specimen Description   Final    URINE, CLEAN CATCH Performed at [facility], [address] [place], [place] [address]    Special Requests   Final    NONE Performed at [facility], [address] [place], [place] [address]    Culture   Final    NO GROWTH Performed at [facility], [address], [place], [place] [address]    Report Status 05/04/2018 FINAL  Final      Radiology Studies: Ct Abdomen Pelvis Wo Contrast  Result Date: 05/02/2018 CLINICAL DATA:  Abdominal distension. EXAM: CT ABDOMEN AND  PELVIS WITHOUT CONTRAST TECHNIQUE: Multidetector CT imaging of the abdomen and pelvis was performed following the standard protocol without IV contrast. COMPARISON:  Radiograph 04/10/2018 FINDINGS: Lower chest: Linear atelectasis in both lower lobes. Heart is normal in size. Coronary artery calcifications. Hepatobiliary: No focal hepatic lesion allowing for lack contrast. Mild gallbladder distention without wall thickening or pericholecystic inflammation. No calcified gallstone. No biliary dilatation. Pancreas: Parenchymal atrophy. No ductal dilatation or inflammation. Spleen: Normal in size without focal abnormality. Adrenals/Urinary Tract: No adrenal nodule. No hydronephrosis or perinephric edema. No urolithiasis. Small exophytic cyst from the lower right kidney. Ureters are decompressed. Urinary bladder is physiologically distended. Stomach/Bowel: Small to moderate hiatal hernia. Stomach is otherwise nondistended. No bowel obstruction. No evidence of bowel wall thickening or inflammatory change. The appendix is normal. Colonic diverticulosis without diverticulitis. There is transverse  colonic tortuosity. Colonic interposition of the hepatic flexure lateral to the liver. Vascular/Lymphatic: Aorto bi-iliac atherosclerosis. No enlarged abdominal or pelvic lymph nodes. Reproductive: Few coarse uterine calcifications may be vascular or fibroids. No adnexal mass. Other: No free air, free fluid, or intra-abdominal fluid collection. Musculoskeletal: Kyphoplasty within L5 compression fracture. Unchanged L4 compression fracture compared to recent lumbar spine MRI. There are no acute or suspicious osseous abnormalities. IMPRESSION: 1. No acute findings or explanation for abdominal distention. 2. Colonic diverticulosis without diverticulitis. Small to moderate hiatal hernia. 3.  Aortic Atherosclerosis (ICD10-I70.0). Electronically Signed   By: Narda Rutherford M.D.   On: 05/02/2018 23:00   Dg Chest 2 View  Result Date: 05/02/2018 CLINICAL DATA:  Increased altered mental status over the last 2 days. EXAM: CHEST - 2 VIEW COMPARISON:  04/10/2018 FINDINGS: Opacity at the right lung base is stable from the prior exam, consistent with atelectasis. Lungs otherwise clear. Cardiac silhouette is normal in size. No mediastinal or hilar masses. No evidence of adenopathy No pleural effusion or pneumothorax. Skeletal structures are intact. IMPRESSION: No active cardiopulmonary disease. Electronically Signed   By: Amie Portland M.D.   On: 05/02/2018 17:11      Scheduled Meds: . enoxaparin (LOVENOX) injection  30 mg Subcutaneous Q24H  . feeding supplement (ENSURE ENLIVE)  237 mL Oral TID BM  . multivitamin with minerals  1 tablet Oral Daily  . sertraline  25 mg Oral QHS   Continuous Infusions: . cefTRIAXone (ROCEPHIN)  IV Stopped (05/03/18 2155)  . magnesium sulfate       LOS: 1 day    Time spent: Total of 35 minutes spent with pt, greater than 50% of which was spent in discussion of  treatment, counseling and coordination of care  Latrelle Dodrill, MD Pager: Text Page via [email or web address]   If 7PM-7AM,  please contact night-coverage [email or web address] 05/04/2018, 4:46 PM   Note - This record has been created using [employer]. Chart creation errors have been sought, but may not always have been located. Such creation errors do not reflect on the standard of medical care.

## 2018-05-05 ENCOUNTER — Telehealth: Payer: Self-pay | Admitting: Internal Medicine

## 2018-05-05 DIAGNOSIS — F0280 Dementia in other diseases classified elsewhere without behavioral disturbance: Secondary | ICD-10-CM

## 2018-05-05 DIAGNOSIS — Z7189 Other specified counseling: Secondary | ICD-10-CM

## 2018-05-05 DIAGNOSIS — Z515 Encounter for palliative care: Secondary | ICD-10-CM

## 2018-05-05 DIAGNOSIS — G20 Parkinson's disease: Secondary | ICD-10-CM

## 2018-05-05 DIAGNOSIS — G9340 Encephalopathy, unspecified: Secondary | ICD-10-CM

## 2018-05-05 LAB — BASIC METABOLIC PANEL
Anion gap: 10 (ref 5–15)
BUN: 17 mg/dL (ref 8–23)
CHLORIDE: 117 mmol/L — AB (ref 98–111)
CO2: 26 mmol/L (ref 22–32)
CREATININE: 0.97 mg/dL (ref 0.44–1.00)
Calcium: 8.1 mg/dL — ABNORMAL LOW (ref 8.9–10.3)
GFR calc non Af Amer: 54 mL/min — ABNORMAL LOW (ref >60)
Glucose, Bld: 113 mg/dL — ABNORMAL HIGH (ref 70–99)
Potassium: 3.1 mmol/L — ABNORMAL LOW (ref 3.5–5.1)
Sodium: 153 mmol/L — ABNORMAL HIGH (ref 135–145)

## 2018-05-05 LAB — CBC WITH DIFFERENTIAL/PLATELET
Basophils Absolute: 0 10*3/uL (ref 0.0–0.1)
Basophils Relative: 0 %
EOS PCT: 3 %
Eosinophils Absolute: 0.2 10*3/uL (ref 0.0–0.7)
HEMATOCRIT: 38.1 % (ref 36.0–46.0)
Hemoglobin: 12.4 g/dL (ref 12.0–15.0)
LYMPHS PCT: 34 %
Lymphs Abs: 2.8 10*3/uL (ref 0.7–4.0)
MCH: 31.8 pg (ref 26.0–34.0)
MCHC: 32.5 g/dL (ref 30.0–36.0)
MCV: 97.7 fL (ref 78.0–100.0)
Monocytes Absolute: 0.8 10*3/uL (ref 0.1–1.0)
Monocytes Relative: 10 %
NEUTROS PCT: 53 %
Neutro Abs: 4.3 10*3/uL (ref 1.7–7.7)
Platelets: 108 10*3/uL — ABNORMAL LOW (ref 150–400)
RBC: 3.9 MIL/uL (ref 3.87–5.11)
RDW: 14.9 % (ref 11.5–15.5)
WBC: 8.1 10*3/uL (ref 4.0–10.5)

## 2018-05-05 LAB — SODIUM: SODIUM: 149 mmol/L — AB (ref 135–145)

## 2018-05-05 LAB — C DIFFICILE QUICK SCREEN W PCR REFLEX
C DIFFICLE (CDIFF) ANTIGEN: POSITIVE — AB
C Diff toxin: NEGATIVE

## 2018-05-05 LAB — MAGNESIUM: Magnesium: 2.1 mg/dL (ref 1.7–2.4)

## 2018-05-05 MED ORDER — POTASSIUM CHLORIDE 10 MEQ/100ML IV SOLN
10.0000 meq | INTRAVENOUS | Status: AC
  Administered 2018-05-05 (×3): 10 meq via INTRAVENOUS
  Filled 2018-05-05 (×3): qty 100

## 2018-05-05 MED ORDER — FENTANYL CITRATE (PF) 100 MCG/2ML IJ SOLN: 12.5000 ug | INTRAMUSCULAR | Status: DC | PRN

## 2018-05-05 NOTE — Progress Notes (Addendum)
PROGRESS NOTE [facility] [place] [facility]   ZOX:[id number] DOB: 03/26/1936  DOA: 05/02/2018 PCP: Angela Cox, MD   Brief Narrative:  Debra Pierce is a 82 year old female with history of severe Parkinson disease/Lewy body dementia, depression and hypertension who was sent from skilled nursing facility after found to be increasingly confused with decreased p.o. intake.  Upon ED evaluation patient appeared to be confused, UA showed features consistent of UTI, elevated sodium at 154, creatinine 1.4 and K 3.1.  Patient was admitted with working diagnosis of acute encephalopathy due to UTI and complicated with hypernatremia.  Subjective: Patient seen and examined, she is a little more alert today.  Still not speaking.  When asked if she does incoherent words.  Assessment & Plan: Acute metabolic encephalopathy - mild improvement  Multifactorial from UTI and dehydration/hypernatremia in setting of severe Parkinson's/Lewy body dementia.  Treat underlying causes.  UTI UA grossly abnormal, patient on empiric antibiotic with Rocephin.  Urine cultures with no growth, however unclear if sample was obtained after antibiotics.  Continue Rocephin will treat for total 5 days.  Hypernatremia Due to severe dehydration and poor oral intake.  Continues to improve, continue gentle hydration for now.  Monitor INO's.  AKI From poor oral intake, creatinine back to baseline. Continue IV hydration as patient has poor oral intake.Marland Kitchen  Avoid nephrotoxic agents and hypotension.  Monitor BMP in a.m.  History of dysphagia SLP evaluation appreciated, patient with moderate risk for aspiration, however difficulty with swallowing.  Currently nutrition is a concern as patient is not eating.  I have discussed with some alternative ways of nutrition, son and family will not like to have any artificial nutrition at this time.  I have discussed that PEG tube will not solve issues as it does not eliminate risk of  aspiration.    Parkinson's/Lewy body dementia Continue to hold Sinemet and entacapone.  Do not see any benefit on this medication at this point.    Hypokalemia  Replete as needed, check BMP  Goals of care Have discussed with son current condition and possible prognosis.  Son told me that he does not want any artificial ventilation or resuscitation, therefore patient has been made DNR.  Discussed ways of nutrition, for which I do not recommend any artificial ways of nutrition at this point.  Patient continues to decline due to recent pneumonia in view of severe Parkinson's/Lewy body dementia.  Subsequently get UTI, dehydration and hypernatremia which furthermore deteriorated mental status.  Patient will be unlikely to recover from this episode.  With poor nutrition and declining mental status patient might qualify for hospice.  Palliative care has been consulted family in agreement.   DVT prophylaxis: Lovenox Code Status: Full code Family Communication: Family at bedside Disposition Plan: Possible hospice  Consultants:   None  Procedures:   None  Antimicrobials:  Rocephin 10/1   Objective: Vitals:   05/04/18 1407 05/04/18 2047 05/05/18 0541 05/05/18 1314  BP: 135/66 126/83 116/88 103/82  Pulse: (!) 115 (!) 111 73 80  Resp: 14 20 18 19   Temp: 98.3 F (36.8 C) 97.6 F (36.4 C) 97.7 F (36.5 C) 98.2 F (36.8 C)  TempSrc: Oral Axillary Oral Oral  SpO2: 96% 100% 98% 100%  Weight:   55.6 kg   Height:        Intake/Output Summary (Last 24 hours) at 05/05/2018 1634 Last data filed at 05/05/2018 1200 Gross per 24 hour  Intake 910 ml  Output 275 ml  Net 635 ml  Filed Weights   05/03/18 0510 05/04/18 0500 05/05/18 0541  Weight: 63.8 kg 59 kg 55.6 kg    Examination:  General: NAD  Cardiovascular: RRR, S1/S2 +, no rubs, no gallops Respiratory: CTA bilaterally, no wheezing, no rhonchi Abdominal: Soft, NT, ND, bowel sounds + Neuro: remains confused, non focal    Extremities: no edema  Data Reviewed: I have personally reviewed following labs and imaging studies  CBC: Recent Labs  Lab 05/02/18 1538 05/02/18 1550 05/03/18 0447 05/05/18 0625  WBC 11.2*  --  10.5 8.1  NEUTROABS  --   --   --  4.3  HGB 13.6 15.3* 13.8 12.4  HCT 44.3 45.0 44.6 38.1  MCV 101.6*  --  100.5* 97.7  PLT 157  --  167 108*   Basic Metabolic Panel: Recent Labs  Lab 05/02/18 1538  05/03/18 0447 05/03/18 0915 05/03/18 1501 05/03/18 1854 05/04/18 1101 05/04/18 1702 05/04/18 2257 05/05/18 0625 05/05/18 1107  NA 154*   < > 166* 163* 161* 160* 157* 157* 154* 153* 149*  K 3.1*   < > 3.8 4.1 3.6 4.1  --   --   --  3.1*  --   CL 121*   < > 129* 126* 128* 126*  --   --   --  117*  --   CO2 25  --  28 26 27 22   --   --   --  26  --   GLUCOSE 100*   < > 119* 115* 117* 132*  --   --   --  113*  --   BUN 64*   < > 61* 54* 50* 42*  --   --   --  17  --   CREATININE 1.50*   < > 1.58* 1.42* 1.29* 1.20*  --   --   --  0.97  --   CALCIUM 7.4*  --  8.5* 8.5* 8.0* 8.3*  --   --   --  8.1*  --   MG 2.3  --  2.4  --   --   --   --   --   --  2.1  --    < > = values in this interval not displayed.   GFR: Estimated Creatinine Clearance: 34.9 mL/min (by C-G formula based on SCr of 0.97 mg/dL). Liver Function Tests: Recent Labs  Lab 05/02/18 1538  AST 24  ALT 9  ALKPHOS 67  BILITOT 0.6  PROT 5.8*  ALBUMIN 2.9*   No results for input(s): LIPASE, AMYLASE in the last 168 hours. No results for input(s): AMMONIA in the last 168 hours. Coagulation Profile: No results for input(s): INR, PROTIME in the last 168 hours. Cardiac Enzymes: No results for input(s): CKTOTAL, CKMB, CKMBINDEX, TROPONINI in the last 168 hours. BNP (last 3 results) No results for input(s): PROBNP in the last 8760 hours. HbA1C: No results for input(s): HGBA1C in the last 72 hours. CBG: Recent Labs  Lab 05/02/18 1551  GLUCAP 103*   Lipid Profile: No results for input(s): CHOL, HDL, LDLCALC, TRIG,  CHOLHDL, LDLDIRECT in the last 72 hours. Thyroid Function Tests: No results for input(s): TSH, T4TOTAL, FREET4, T3FREE, THYROIDAB in the last 72 hours. Anemia Panel: No results for input(s): VITAMINB12, FOLATE, FERRITIN, TIBC, IRON, RETICCTPCT in the last 72 hours. Sepsis Labs: Recent Labs  Lab 05/02/18 1550  LATICACIDVEN 1.72    Recent Results (from the past 240 hour(s))  Urine culture     Status: None  Collection Time: 05/02/18  7:07 PM  Result Value Ref Range Status   Specimen Description   Final    URINE, CLEAN CATCH Performed at [facility], [address] [place], [place] [address]    Special Requests   Final    NONE Performed at [facility], [address], [place], [place] [address]    Culture   Final    NO GROWTH Performed at [facility], [address] [place], [place] [address]    Report Status 05/04/2018 FINAL  Final      Radiology Studies: No results found.    Scheduled Meds: . enoxaparin (LOVENOX) injection  30 mg Subcutaneous Q24H  . feeding supplement (ENSURE ENLIVE)  237 mL Oral TID BM  . multivitamin with minerals  1 tablet Oral Daily  . sertraline  25 mg Oral QHS   Continuous Infusions: . cefTRIAXone (ROCEPHIN)  IV 1 g (05/04/18 2042)  . dextrose 100 mL/hr at 05/05/18 0631  . magnesium sulfate       LOS: 2 days    Time spent: Total of 35 minutes spent with pt, greater than 50% of which was spent in discussion of  treatment, counseling and coordination of care  Latrelle Dodrill, MD Pager: Text Page via [email or web address]   If 7PM-7AM, please contact night-coverage [email or web address] 05/05/2018, 4:34 PM   Note - This record has been created using [employer]. Chart creation errors have been sought, but may not always have been located. Such creation errors do not reflect on the standard of medical care.

## 2018-05-05 NOTE — Telephone Encounter (Signed)
Returned phone call of son, Brian [place].  Discussed findings of Palliative care visit while at [facility].  Pt is now hospitalized again with a UTI and son reports that he sees that she is having a rapid decline.  She was living with his brother and only moved to [facility] ago with 2 hospitalizations during this period.  He states that he is interested in comfort care for his mother without return to the hospital.  He also desires her code status to be DNAR and no tube feeding in the future. Long term living plan is to return to [facility] after discharge from hospital.   General discussion related to Hospice/Comfort care which is has no objection to and pt appears appropriate for transition to Hospice.  He also has questions related to burial arrangements at [employer] [place] with her husband.  Encouraged son to talk to Child psychotherapist or Physicist, medical to discuss this coordination. [facility] will followup with patient upon her return to snf.

## 2018-05-05 NOTE — Consult Note (Signed)
Consultation Note Date: 05/05/2018   Patient Name: Debra Pierce  DOB: 07-08-1936  MRN: [id number]  Age / Sex: 82 y.o., female  PCP: Angela Cox, MD Referring Physician: Randel Pigg, Dorma Russell, MD  Reason for Consultation: Establishing goals of care  HPI/Patient Profile: 82 y.o. female  with past medical history of Parkinson's Disease with dementia, HTN, HLD, depression, neuropathy, GERD, recent kyphoplasty which has led to decline to wheelchair bound status admitted on 04/10/2018-04/24/2018 with altered mental status and treated for RML pneumonia and increased Sinemet when she had poor progress but had improvements with alertness and intake closer to discharge. Admitted on 05/02/2018 with UTI, dehydration, hypernatremia, AKI.   Clinical Assessment and Goals of Care: [name] is known to me from her past admission. She has unfortunately continued to decline and has been readmitted with UTI and dehydration.   I called and spoke with her son, Arlys John, who I had many conversations with last admission. Arlys John explained to me that she did not progress well at rehab and was unable to participate with therapy. After rehab was halted she continued to decline significantly over the past week. He had noted this decline.   Arlys John shares with me now that he and his family have had time to think and consider about his mother's QOL and what her wishes would be. He feels that they are in a place where they are accepting that she is at EOL and do not feel feeding tubes or aggressive care is in her best interest. His only concern is that his brother is unable now and plans to come next weekend. He would like for his brother to be able to be with her one last time and he feels like this would bring his mother and brother closure. However, they understand that she could continue to decline and pass away at any time.   We discussed plan  to treat UTI and rehydrate with fluids and once UTI is treated we should transition to hospice facility to focus on comfort care. Will not escalate care and otherwise focus on keeping her comfortable.   Primary Decision Maker NEXT OF KIN Arlys John and Bill    SUMMARY OF RECOMMENDATIONS   - DNR confirmed - No feeding tube or escalation of care - Treat UTI and continue IVF while hospitalized - Transition to hospice facility   Code Status/Advance Care Planning:  DNR   Symptom Management:   Pain/SOB: Fentanyl 12.5 mcg IV every 2 hours prn.   Frequent mouth care for comfort.   Please add and adjust medications to ensure comfort and minimize suffering.   Palliative Prophylaxis:   Aspiration, Bowel Regimen, Delirium Protocol, Frequent Pain Assessment, Oral Care and Turn Reposition  Additional Recommendations (Limitations, Scope, Preferences):  No Artificial Feeding  Psycho-social/Spiritual:   Desire for further Chaplaincy support:yes  Additional Recommendations: Caregiving  Support/Resources, Education on Hospice and Grief/Bereavement Support  Prognosis:   < 2 weeks  Discharge Planning: Hospice facility      Primary Diagnoses: Present on Admission: . Acute  encephalopathy . Dementia due to Parkinson's disease without behavioral disturbance (HCC) . ARF (acute renal failure) (HCC) . Hypernatremia . Hypokalemia . Acute lower UTI   I have reviewed the medical record, interviewed the patient and family, and examined the patient. The following aspects are pertinent.  Past Medical History:  Diagnosis Date  . Benign essential HTN   . Depression   . HLD (hyperlipidemia)   . Neuropathy   . Parkinson's disease dementia [facility])    Social History   Socioeconomic History  . Marital status: Married    Spouse name: Not on file  . Number of children: Not on file  . Years of education: Not on file  . Highest education level: Not on file  Occupational History  . Not on file   Social Needs  . Financial resource strain: Patient refused  . Food insecurity:    Worry: Patient refused    Inability: Patient refused  . Transportation needs:    Medical: Patient refused    Non-medical: Patient refused  Tobacco Use  . Smoking status: Never Smoker  . Smokeless tobacco: Never Used  Substance and Sexual Activity  . Alcohol use: Not Currently  . Drug use: Never  . Sexual activity: Not Currently  Lifestyle  . Physical activity:    Days per week: Patient refused    Minutes per session: Patient refused  . Stress: Not on file  Relationships  . Social connections:    Talks on phone: Patient refused    Gets together: Patient refused    Attends religious service: Patient refused    Active member of club or organization: Patient refused    Attends meetings of clubs or organizations: Patient refused    Relationship status: Patient refused  Other Topics Concern  . Not on file  Social History Narrative  . Not on file   History reviewed. No pertinent family history. Scheduled Meds: . enoxaparin (LOVENOX) injection  30 mg Subcutaneous Q24H  . feeding supplement (ENSURE ENLIVE)  237 mL Oral TID BM  . multivitamin with minerals  1 tablet Oral Daily  . sertraline  25 mg Oral QHS   Continuous Infusions: . cefTRIAXone (ROCEPHIN)  IV 1 g (05/04/18 2042)  . dextrose 100 mL/hr at 05/05/18 0631  . magnesium sulfate     PRN Meds:.acetaminophen **OR** acetaminophen, ondansetron **OR** ondansetron (ZOFRAN) IV, RESOURCE THICKENUP CLEAR Allergies  Allergen Reactions  . Codeine Other (See Comments)    Family stated unknown reaction  . Hydrocodone    Review of Systems  Unable to perform ROS: Acuity of condition    Physical Exam  Constitutional: She appears cachectic. She has a sickly appearance. She appears ill.  Cardiovascular: Normal rate.  Pulmonary/Chest: Effort normal. No accessory muscle usage. No tachypnea. No respiratory distress.  Abdominal: Soft. Normal  appearance.  Neurological:  Arouses and attempts to verbalize but unable to understand her as voice is garbled  Nursing note and vitals reviewed.   Vital Signs: BP 103/82 (BP Location: Left Arm)   Pulse 80   Temp 98.2 F (36.8 C) (Oral)   Resp 19   Ht 5\' 1"  (1.549 m)   Wt 55.6 kg   SpO2 100%   BMI 23.16 kg/m  Pain Scale: PAINAD   Pain Score: 1    SpO2: SpO2: 100 % O2 Device:SpO2: 100 % O2 Flow Rate: .   IO: Intake/output summary:   Intake/Output Summary (Last 24 hours) at 05/05/2018 1641 Last data filed at 05/05/2018 1200 Gross per 24  hour  Intake 910 ml  Output 275 ml  Net 635 ml    LBM: Last BM Date: 05/05/18 Baseline Weight: Weight: 63.8 kg Most recent weight: Weight: 55.6 kg     Palliative Assessment/Data: 20%     Time In: 1600   Time Out: 1710 Time Total: 70 min Greater than 50%  of this time was spent counseling and coordinating care related to the above assessment and plan.  Signed by: Yong Channel, NP Palliative Medicine Team Pager # [phone] (M-F 8a-5p) Team Phone # [phone] (Nights/Weekends)

## 2018-05-06 LAB — BASIC METABOLIC PANEL
Anion gap: 11 (ref 5–15)
BUN: 6 mg/dL — ABNORMAL LOW (ref 8–23)
CALCIUM: 8.4 mg/dL — AB (ref 8.9–10.3)
CHLORIDE: 109 mmol/L (ref 98–111)
CO2: 27 mmol/L (ref 22–32)
CREATININE: 0.76 mg/dL (ref 0.44–1.00)
GFR calc non Af Amer: >60 mL/min (ref >60)
GLUCOSE: 132 mg/dL — AB (ref 70–99)
Potassium: 3.4 mmol/L — ABNORMAL LOW (ref 3.5–5.1)
Sodium: 147 mmol/L — ABNORMAL HIGH (ref 135–145)

## 2018-05-06 LAB — MAGNESIUM: Magnesium: 2 mg/dL (ref 1.7–2.4)

## 2018-05-06 LAB — CLOSTRIDIUM DIFFICILE BY PCR, REFLEXED: Toxigenic C. Difficile by PCR: POSITIVE — AB

## 2018-05-06 MED ORDER — POTASSIUM CHLORIDE CRYS ER 20 MEQ PO TBCR
40.0000 meq | EXTENDED_RELEASE_TABLET | Freq: Once | ORAL | Status: DC
  Filled 2018-05-06: qty 2

## 2018-05-06 MED ORDER — VANCOMYCIN 50 MG/ML ORAL SOLUTION
125.0000 mg | Freq: Four times a day (QID) | ORAL | Status: DC
  Administered 2018-05-06 – 2018-05-07 (×3): 125 mg via ORAL
  Filled 2018-05-06 (×5): qty 2.5

## 2018-05-06 NOTE — Progress Notes (Signed)
PROGRESS NOTE [facility]   ZOX:[id number] DOB: 1935-10-04  DOA: 05/02/2018 PCP: Angela Cox, MD   Brief Narrative:  Debra Pierce is a 82 year old female with history of severe Parkinson disease/Lewy body dementia, depression and hypertension who was sent from skilled nursing facility after found to be increasingly confused with decreased p.o. intake.  Upon ED evaluation patient appeared to be confused, UA showed features consistent of UTI, elevated sodium at 154, creatinine 1.4 and K 3.1.  Patient was admitted with working diagnosis of acute encephalopathy due to UTI and complicated with hypernatremia.  Subjective: Patient seen and examined, this morning she was very responsive and lethargic.  Her son tells me the patient this afternoon is more responsive and interactive than yesterday.  Patient was having a lot of diarrhea yesterday, C. difficile was sent which came back positive.  Assessment & Plan: Acute metabolic encephalopathy  Multifactorial from UTI and dehydration/hypernatremia in setting of severe Parkinson's/Lewy body dementia.  Per family mild improvement today.  Treat underlying causes  UTI UA grossly abnormal, patient on empiric antibiotic with Rocephin.  Urine cultures with no growth, however unclear if sample was obtained after antibiotics.  Continue Rocephin will treat for total 5 days.  Hypernatremia Due to severe dehydration and poor oral intake.  Sodium almost normalized, will continue gentle hydration for now.  Monitor I&O's.  AKI - resolved From poor oral intake, creatinine back to baseline. Continue IV hydration as patient has poor oral intake.Marland Kitchen  Avoid nephrotoxic agents and hypotension.  Monitor BMP in a.m.  C. difficile diarrhea C. difficile antigen and toxigenic PCR positive.  Will start oral vancomycin, if no significant improvement in the next 24 hours, family okay to transition to hospice facility.  History of dysphagia SLP  evaluation appreciated, patient with moderate risk for aspiration.  Continue dysphagia diet.  Palliative care consult appreciated.  Family does not want to escalate treatment, no PEG tube.   Parkinson's/Lewy body dementia Continue to hold Sinemet and entacapone.  Do not see any benefit on this medication at this point.    Hypokalemia  Replete as needed, check BMP  Goals of care Per family conversation with palliative care no escalation of treatment.  For now treat UTI, dehydration and C. difficile.  If no significant improvement transition to hospice facility.   DVT prophylaxis: Lovenox Code Status: Full code Family Communication: Family at bedside Disposition Plan: Hospice facility in the next 24 to 48 hours.  Consultants:   None  Procedures:   None  Antimicrobials:  Rocephin 10/1   Objective: Vitals:   05/05/18 1314 05/05/18 2051 05/06/18 0502 05/06/18 0510  BP: 103/82 132/82  128/84  Pulse: 80 97  92  Resp: 19 18  20   Temp: 98.2 F (36.8 C) 98.5 F (36.9 C)  97.7 F (36.5 C)  TempSrc: Oral Axillary    SpO2: 100% 99%  99%  Weight:   58.1 kg   Height:        Intake/Output Summary (Last 24 hours) at 05/06/2018 1458 Last data filed at 05/06/2018 1450 Gross per 24 hour  Intake 1020 ml  Output 200 ml  Net 820 ml   Filed Weights   05/04/18 0500 05/05/18 0541 05/06/18 0502  Weight: 59 kg 55.6 kg 58.1 kg    Examination: - No changes on exam from 10/4  General: NAD  Cardiovascular: RRR, S1/S2 +, no rubs, no gallops Respiratory: CTA bilaterally, no wheezing, no rhonchi Abdominal: Soft, NT, ND, bowel sounds +  Neuro: remains confused, non focal  Extremities: no edema  Data Reviewed: I have personally reviewed following labs and imaging studies  CBC: Recent Labs  Lab 05/02/18 1538 05/02/18 1550 05/03/18 0447 05/05/18 0625  WBC 11.2*  --  10.5 8.1  NEUTROABS  --   --   --  4.3  HGB 13.6 15.3* 13.8 12.4  HCT 44.3 45.0 44.6 38.1  MCV 101.6*  --  100.5*  97.7  PLT 157  --  167 108*   Basic Metabolic Panel: Recent Labs  Lab 05/02/18 1538  05/03/18 0447 05/03/18 0915 05/03/18 1501 05/03/18 1854  05/04/18 1702 05/04/18 2257 05/05/18 0625 05/05/18 1107 05/06/18 0844  NA 154*   < > 166* 163* 161* 160*   < > 157* 154* 153* 149* 147*  K 3.1*   < > 3.8 4.1 3.6 4.1  --   --   --  3.1*  --  3.4*  CL 121*   < > 129* 126* 128* 126*  --   --   --  117*  --  109  CO2 25  --  28 26 27 22   --   --   --  26  --  27  GLUCOSE 100*   < > 119* 115* 117* 132*  --   --   --  113*  --  132*  BUN 64*   < > 61* 54* 50* 42*  --   --   --  17  --  6*  CREATININE 1.50*   < > 1.58* 1.42* 1.29* 1.20*  --   --   --  0.97  --  0.76  CALCIUM 7.4*  --  8.5* 8.5* 8.0* 8.3*  --   --   --  8.1*  --  8.4*  MG 2.3  --  2.4  --   --   --   --   --   --  2.1  --  2.0   < > = values in this interval not displayed.   GFR: Estimated Creatinine Clearance: 46 mL/min (by C-G formula based on SCr of 0.76 mg/dL). Liver Function Tests: Recent Labs  Lab 05/02/18 1538  AST 24  ALT 9  ALKPHOS 67  BILITOT 0.6  PROT 5.8*  ALBUMIN 2.9*   No results for input(s): LIPASE, AMYLASE in the last 168 hours. No results for input(s): AMMONIA in the last 168 hours. Coagulation Profile: No results for input(s): INR, PROTIME in the last 168 hours. Cardiac Enzymes: No results for input(s): CKTOTAL, CKMB, CKMBINDEX, TROPONINI in the last 168 hours. BNP (last 3 results) No results for input(s): PROBNP in the last 8760 hours. HbA1C: No results for input(s): HGBA1C in the last 72 hours. CBG: Recent Labs  Lab 05/02/18 1551  GLUCAP 103*   Lipid Profile: No results for input(s): CHOL, HDL, LDLCALC, TRIG, CHOLHDL, LDLDIRECT in the last 72 hours. Thyroid Function Tests: No results for input(s): TSH, T4TOTAL, FREET4, T3FREE, THYROIDAB in the last 72 hours. Anemia Panel: No results for input(s): VITAMINB12, FOLATE, FERRITIN, TIBC, IRON, RETICCTPCT in the last 72 hours. Sepsis  Labs: Recent Labs  Lab 05/02/18 1550  LATICACIDVEN 1.72    Recent Results (from the past 240 hour(s))  Urine culture     Status: None   Collection Time: 05/02/18  7:07 PM  Result Value Ref Range Status   Specimen Description   Final    URINE, CLEAN CATCH Performed at [facility], [address]  [place], [place] [address]    Special Requests   Final    NONE Performed at [facility], [address] [place], [place] [address]    Culture   Final    NO GROWTH Performed at [facility], [address], [place], [place] [address]    Report Status 05/04/2018 FINAL  Final  C difficile quick scan w PCR reflex     Status: Abnormal   Collection Time: 05/05/18  2:18 PM  Result Value Ref Range Status   C Diff antigen POSITIVE (A) NEGATIVE Final   C Diff toxin NEGATIVE NEGATIVE Final   C Diff interpretation Results are indeterminate. See PCR results.  Final    Comment: Performed at [facility], [address] [place], [place] [address]  C. Diff by PCR, Reflexed     Status: Abnormal   Collection Time: 05/05/18  2:18 PM  Result Value Ref Range Status   Toxigenic C. Difficile by PCR POSITIVE (A) NEGATIVE Final    Comment: Positive for toxigenic C. difficile with little to no toxin production. Only treat if clinical presentation suggests symptomatic illness. Performed at [facility], [address] [place], [place] [address]       Radiology Studies: No results found.    Scheduled Meds: . enoxaparin (LOVENOX) injection  30 mg Subcutaneous Q24H  . feeding supplement (ENSURE ENLIVE)  237 mL Oral TID BM  . multivitamin with minerals  1 tablet Oral Daily  . sertraline  25 mg Oral QHS  . vancomycin  125 mg Oral QID   Continuous Infusions: . cefTRIAXone (ROCEPHIN)  IV Stopped (05/05/18 2330)  . dextrose 100 mL/hr at 05/06/18 1324  . magnesium sulfate       LOS: 3 days    Time spent: Total of 35 minutes  spent with pt, greater than 50% of which was spent in discussion of  treatment, counseling and coordination of care  Latrelle Dodrill, MD Pager: Text Page via [email or web address]   If 7PM-7AM, please contact night-coverage [email or web address] 05/06/2018, 2:58 PM   Note - This record has been created using [employer]. Chart creation errors have been sought, but may not always have been located. Such creation errors do not reflect on the standard of medical care.

## 2018-05-06 NOTE — Clinical Social Work Note (Addendum)
CSW received consult from physician, that patient's family would like [facility] for hospice placement.  CSW contacted [place] at [facility] and she will review patient's information and then contact CSW back.  She said there are currently not any beds today.  CSW to continue to follow patient's progress throughout discharge planning.  Ervin Knack. Valary Manahan, MSW, [facility] Majors [phone]  05/06/2018 3:37 PM

## 2018-05-06 NOTE — Progress Notes (Signed)
[facility]) Hospital Liaison RN note.  Received request from Windell Moulding, CSW for family interest in [facility]. Chart under review and spoke with family to acknowledge referral.  Unfortunately [facility] is not able to offer a room today. Family and CSW are aware [facility] liaison will follow up with CSW and family tomorrow or sooner if room becomes available. Please do not hesitate to call with questions.  Thank you,   Ileene Allie, RN, [facility]  [facility] Liaison  [phone]  [facility] are on AMION

## 2018-05-07 LAB — BASIC METABOLIC PANEL
Anion gap: 10 (ref 5–15)
BUN: 5 mg/dL — ABNORMAL LOW (ref 8–23)
CHLORIDE: 110 mmol/L (ref 98–111)
CO2: 22 mmol/L (ref 22–32)
CREATININE: 0.67 mg/dL (ref 0.44–1.00)
Calcium: 7.9 mg/dL — ABNORMAL LOW (ref 8.9–10.3)
GFR calc non Af Amer: >60 mL/min (ref >60)
Glucose, Bld: 109 mg/dL — ABNORMAL HIGH (ref 70–99)
Potassium: 2.7 mmol/L — CL (ref 3.5–5.1)
SODIUM: 142 mmol/L (ref 135–145)

## 2018-05-07 LAB — MAGNESIUM: MAGNESIUM: 1.8 mg/dL (ref 1.7–2.4)

## 2018-05-07 MED ORDER — ACETAMINOPHEN 325 MG PO TABS: 650.0000 mg | ORAL_TABLET | Freq: Four times a day (QID) | ORAL | Status: DC | PRN

## 2018-05-07 MED ORDER — HALOPERIDOL LACTATE 2 MG/ML PO CONC
0.5000 mg | ORAL | Status: DC | PRN
  Filled 2018-05-07: qty 0.3

## 2018-05-07 MED ORDER — GLYCOPYRROLATE 0.2 MG/ML IJ SOLN
0.2000 mg | INTRAMUSCULAR | Status: DC | PRN
  Filled 2018-05-07: qty 1

## 2018-05-07 MED ORDER — HALOPERIDOL LACTATE 5 MG/ML IJ SOLN: 0.5000 mg | INTRAMUSCULAR | Status: DC | PRN

## 2018-05-07 MED ORDER — HALOPERIDOL 0.5 MG PO TABS
0.5000 mg | ORAL_TABLET | ORAL | Status: DC | PRN
  Filled 2018-05-07: qty 1

## 2018-05-07 MED ORDER — ONDANSETRON HCL 4 MG/2ML IJ SOLN: 4.0000 mg | Freq: Four times a day (QID) | INTRAMUSCULAR | Status: DC | PRN

## 2018-05-07 MED ORDER — GLYCOPYRROLATE 1 MG PO TABS
1.0000 mg | ORAL_TABLET | ORAL | Status: DC | PRN
  Filled 2018-05-07: qty 1

## 2018-05-07 MED ORDER — ONDANSETRON 4 MG PO TBDP: 4.0000 mg | ORAL_TABLET | Freq: Four times a day (QID) | ORAL | Status: DC | PRN

## 2018-05-07 MED ORDER — ACETAMINOPHEN 650 MG RE SUPP: 650.0000 mg | Freq: Four times a day (QID) | RECTAL | Status: DC | PRN

## 2018-05-07 MED ORDER — POLYVINYL ALCOHOL 1.4 % OP SOLN
1.0000 [drp] | Freq: Four times a day (QID) | OPHTHALMIC | Status: DC | PRN
  Filled 2018-05-07: qty 15

## 2018-05-07 MED ORDER — BIOTENE DRY MOUTH MT LIQD: 15.0000 mL | OROMUCOSAL | Status: DC | PRN

## 2018-05-07 MED ORDER — POTASSIUM CHLORIDE 10 MEQ/100ML IV SOLN
10.0000 meq | INTRAVENOUS | Status: AC
  Administered 2018-05-07 (×4): 10 meq via INTRAVENOUS
  Filled 2018-05-07 (×4): qty 100

## 2018-05-07 NOTE — Progress Notes (Addendum)
[facility] has an available bed today, per attending physician patient is stable for discharge. [place] Bali from [facility] to meet with family to complete paperwork.   CSW following for discharge needs.  Family to complete paperwork at 5pm. Will fax [facility] discharge summary once completed.  Enid Cutter, LCSW-A Clinical Social Worker [phone]

## 2018-05-07 NOTE — Progress Notes (Signed)
Report called to [place], RN @ [facility].  Earnest Conroy. Clelia Croft, RN

## 2018-05-07 NOTE — Progress Notes (Signed)
[facility]) Hospital Liaison RN note.  Received request from Stephanie Acre, [place] for family interest in [facility] with request for transfer today. Chart reviewed and eligibility approved. Met son, Aaron Edelman  to confirm interest and explain services. Family agreeable to transfer today CSW aware.  Registration paper work completed. Dr. Orpah Melter to assume care per family request.  Please fax discharge summary to [phone]. RN please call report to [phone]. Please arrange transport for patient.  Thank you,   Gertha Lichtenberg, RN, [facility] Liaison  [place] are on AMION

## 2018-05-07 NOTE — Progress Notes (Addendum)
Hospice paperwork completed and discharge summary has been faxed to [facility] [phone]).  [facility] able to accept patient today. CSW will leave packet in chart.  RN please call report to [phone].  Enid Cutter, LCSW-A Clinical Social Worker [phone]

## 2018-05-07 NOTE — Discharge Summary (Signed)
Physician Discharge Summary  Debra Pierce  WUJ:[id number]  DOB: 1936/07/11  DOA: 05/02/2018 PCP: Angela Cox, MD  Admit date: 05/02/2018 Discharge date: 05/07/2018  Admitted From: ALF  Disposition: Hospice facility - [facility]   Discharge Condition: Hospice/comfort care  CODE STATUS: DNR  Diet recommendation: Dysphagia - Comfort feeds   Brief/Interim Summary: For full details see H&P/Progress note, but in brief, Debra Pierce is a 82 year old female with history of severe Parkinson disease/Lewy body dementia, depression and hypertension who was sent from skilled nursing facility after found to be increasingly confused with decreased p.o. intake.  Upon ED evaluation patient appeared to be confused, UA showed features consistent of UTI, elevated sodium at 154, creatinine 1.4 and K 3.1.  Patient was admitted with working diagnosis of acute encephalopathy due to UTI and complicated with hypernatremia.  Patient was treated with antibiotics and IV fluid with improvement of lab findings however remained encephalopathic.  She developed diarrhea and found to be C. difficile positive attempted to treat with vancomycin was done however patient did not tolerate by mouth medication.  Given patient continues declining and unable to get nutritional support palliative care was consulted and decision of comfort care with no aggressive measurement was made by family. Patient was transitioned to comfort measures and referred to hospice facility.  Discharge Diagnoses/Hospital Course:  Principal Problem:   Acute encephalopathy Active Problems:   Dementia due to Parkinson's disease without behavioral disturbance (HCC)   ARF (acute renal failure) (HCC)   Hypernatremia   Hypokalemia   Acute lower UTI  Acute metabolic encephalopathy  Multifactorial from UTI and dehydration/hypernatremia in setting of severe Parkinson's/Lewy body dementia.  Continues to be encephalopathic, treatment plan have been shifted to  comfort measures.  UTI UA grossly abnormal, she completed empiric antibiotic treatment with Rocephin for total of 5 days. Urine cultures with no growth, however unclear if sample was obtained after antibiotics.  Hypernatremia Due to severe dehydration and poor oral intake.  Sodium normalized.  No more labs drawn as patient is comfort care.   AKI - resolved From poor oral intake, creatinine returned back to baseline  C. difficile diarrhea C. difficile antigen and toxigenic PCR positive.  Patient was started on oral vancomycin however not willing to take medication, per nursing staff she is not swallowing well.  I have discussed with her son are tentative IV therapy however explained that this may not change course of her declining given that she is very weak and unable to get nutrition.  Given that patient is not having diarrhea currently and treatment for C. difficile will not change course family opted for withdrawal therapy at this time.  I agree  History of dysphagia SLP evaluation appreciated, patient with moderate risk for aspiration.  Continue dysphagia diet for comfort feeds if patient wants.  Family does not want to escalate treatment, no PEG tube.  Parkinson's/Lewy body dementia Continue to hold Sinemet and entacapone.  Do not see any benefit on this medication at this point.    Hypokalemia  Repleted today, no more labs drawn.  Patient on comfort measures.   Discharge Instructions  You were cared for by a hospitalist during your hospital stay. If you have any questions about your discharge medications or the care you received while you were in the hospital after you are discharged, you can call the unit and asked to speak with the hospitalist on call if the hospitalist that took care of you is not available. Once you are discharged, your  primary care physician will handle any further medical issues. Please note that NO REFILLS for any discharge medications will be authorized  once you are discharged, as it is imperative that you return to your primary care physician (or establish a relationship with a primary care physician if you do not have one) for your aftercare needs so that they can reassess your need for medications and monitor your lab values.   Allergies as of 05/07/2018      Reactions   Codeine Other (See Comments)   Family stated unknown reaction   Hydrocodone       Medication List    STOP taking these medications   carbidopa-levodopa 25-100 MG tablet Commonly known as:  SINEMET IR   entacapone 200 MG tablet Commonly known as:  COMTAN   food thickener Powd Commonly known as:  THICK IT   MINERIN Crea   multivitamin with minerals tablet   sertraline 25 MG tablet Commonly known as:  ZOLOFT     TAKE these medications   feeding supplement (ENSURE ENLIVE) Liqd Take 237 mLs by mouth 3 (three) times daily between meals.       Allergies  Allergen Reactions  . Codeine Other (See Comments)    Family stated unknown reaction  . Hydrocodone     Consultations:  Palliative care    Procedures/Studies: Ct Abdomen Pelvis Wo Contrast  Result Date: 05/02/2018 CLINICAL DATA:  Abdominal distension. EXAM: CT ABDOMEN AND PELVIS WITHOUT CONTRAST TECHNIQUE: Multidetector CT imaging of the abdomen and pelvis was performed following the standard protocol without IV contrast. COMPARISON:  Radiograph 04/10/2018 FINDINGS: Lower chest: Linear atelectasis in both lower lobes. Heart is normal in size. Coronary artery calcifications. Hepatobiliary: No focal hepatic lesion allowing for lack contrast. Mild gallbladder distention without wall thickening or pericholecystic inflammation. No calcified gallstone. No biliary dilatation. Pancreas: Parenchymal atrophy. No ductal dilatation or inflammation. Spleen: Normal in size without focal abnormality. Adrenals/Urinary Tract: No adrenal nodule. No hydronephrosis or perinephric edema. No urolithiasis. Small exophytic  cyst from the lower right kidney. Ureters are decompressed. Urinary bladder is physiologically distended. Stomach/Bowel: Small to moderate hiatal hernia. Stomach is otherwise nondistended. No bowel obstruction. No evidence of bowel wall thickening or inflammatory change. The appendix is normal. Colonic diverticulosis without diverticulitis. There is transverse colonic tortuosity. Colonic interposition of the hepatic flexure lateral to the liver. Vascular/Lymphatic: Aorto bi-iliac atherosclerosis. No enlarged abdominal or pelvic lymph nodes. Reproductive: Few coarse uterine calcifications may be vascular or fibroids. No adnexal mass. Other: No free air, free fluid, or intra-abdominal fluid collection. Musculoskeletal: Kyphoplasty within L5 compression fracture. Unchanged L4 compression fracture compared to recent lumbar spine MRI. There are no acute or suspicious osseous abnormalities. IMPRESSION: 1. No acute findings or explanation for abdominal distention. 2. Colonic diverticulosis without diverticulitis. Small to moderate hiatal hernia. 3.  Aortic Atherosclerosis (ICD10-I70.0). Electronically Signed   By: Narda Rutherford M.D.   On: 05/02/2018 23:00   Dg Chest 2 View  Result Date: 05/02/2018 CLINICAL DATA:  Increased altered mental status over the last 2 days. EXAM: CHEST - 2 VIEW COMPARISON:  04/10/2018 FINDINGS: Opacity at the right lung base is stable from the prior exam, consistent with atelectasis. Lungs otherwise clear. Cardiac silhouette is normal in size. No mediastinal or hilar masses. No evidence of adenopathy No pleural effusion or pneumothorax. Skeletal structures are intact. IMPRESSION: No active cardiopulmonary disease. Electronically Signed   By: Amie Portland M.D.   On: 05/02/2018 17:11   Dg Chest 2 View  Result Date: 04/10/2018 CLINICAL DATA:  Mental status change EXAM: CHEST - 2 VIEW COMPARISON:  None. FINDINGS: The lungs are adequately inflated. The lung markings are coarse in the  anterior inferior aspect of the right lung likely in the middle lobe. There is no pleural effusion. The heart and pulmonary vascularity are normal. The mediastinum is normal in width. The trachea is midline. The bony thorax exhibits no acute abnormality. There is faint calcification in the wall of the aortic arch. IMPRESSION: Probable atelectasis or pneumonia in the right middle lobe anteromedially. The findings are accentuated by mild hypoinflation. Followup PA and lateral chest X-ray is recommended in 3-4 weeks following trial of antibiotic therapy to ensure resolution and exclude underlying malignancy. Electronically Signed   By: David  [place] M.D.   On: 04/10/2018 14:10   Dg Abd 1 View  Result Date: 04/10/2018 CLINICAL DATA:  Screening for foreign body EXAM: ABDOMEN - 1 VIEW COMPARISON:  None. FINDINGS: Lung bases are clear. Gas pattern is nonobstructed. Presumed button snap or monitoring lead at the right cardio phrenic sulcus. Kyphoplasty material at L5. IMPRESSION: Nonobstructed gas pattern. Radiopaque snap or support lead at the right cardio phrenic angle, correlate with physical exam. Otherwise negative for metallic density radiopaque foreign body. Electronically Signed   By: Jasmine Pang M.D.   On: 04/10/2018 22:48   Ct Head Wo Contrast  Result Date: 04/10/2018 CLINICAL DATA:  Confusion with garbled speech and recent hallucinations EXAM: CT HEAD WITHOUT CONTRAST TECHNIQUE: Contiguous axial images were obtained from the base of the skull through the vertex without intravenous contrast. COMPARISON:  None. FINDINGS: Note that there is a degree of motion artifact. Brain: The ventricles and sulci overall are within normal limits for age. There is asymmetric atrophy along the left anterior temporal lobe compared to the right. There is no evident intracranial mass, hemorrhage, extra-axial fluid collection, or midline shift. There is decreased attenuation at the anterior left superior temporal-frontal  junction which is concerning for a potentially recent small infarct. This finding is best appreciated on the coronal images. There is extensive small vessel disease throughout the centra semiovale bilaterally. Decreased attenuation is noted in the anterior limb of the left external capsule as well as in both internal capsules. Small vessel disease is also noted in each lateral thalamus. Vascular: There is no evident hyperdense vessel. There is calcification in each carotid siphon. Skull: Bony calvarium appears intact. Sinuses/Orbits: There is opacification throughout the left frontal sinus. There is mucosal thickening in multiple ethmoid air cells. There is mucosal thickening throughout much of the left maxillary antrum. Orbits appear symmetric bilaterally. Other: Mastoid air cells are clear. IMPRESSION: 1. Age uncertain focal infarct superior left temporal-left frontal junction on the left, best appreciated on the coronal images, most notably coronal slice 38 series 6. 2.  Extensive supratentorial small vessel disease. 3.  No evident mass or hemorrhage. 4.  There are foci of vascular calcification. 5.  Multifocal paranasal sinus disease. Electronically Signed   By: Bretta Bang III M.D.   On: 04/10/2018 14:28   Mr [name]  Result Date: 04/19/2018 CLINICAL DATA:  Back pain and recent kyphoplasty. Parkinson's disease. EXAM: MRI LUMBAR SPINE WITHOUT CONTRAST TECHNIQUE: Multiplanar, multisequence MR imaging of the lumbar spine was performed. No intravenous contrast was administered. COMPARISON:  Lumbar spine MRI 04/10/2018 FINDINGS: Segmentation:  Normal Alignment:  Normal Vertebrae: Status post vertebral augmentation at L5, where there is a severe compression fracture with approximately 90% vertebral body height loss.  There is increased T2-weighted signal within the bone marrow, extending to the posterior elements. The marrow signal of the lumbar spine is otherwise normal. Moderate chronic  height loss of L4 without edema. Conus medullaris and cauda equina: Conus extends to the L1 level. Conus and cauda equina appear normal. Paraspinal and other soft tissues: Negative Disc levels: T10-T11: Imaged only in the sagittal plane.  Normal. T11-T12: Normal. T12-L1: Normal. L1-L2: Normal disc space. Mild facet hypertrophy. No spinal canal or neural foraminal stenosis. L2-L3: Mild disc bulge without spinal canal or neural foraminal stenosis. Normal facets. L3-L4: Large left eccentric disc bulge narrowing both lateral recesses, left worse than right. No central spinal canal stenosis. There is moderate left neural foraminal stenosis. L4-L5: Severe facet hypertrophy with large disc bulge. Moderate spinal canal stenosis. Severe right and moderate left neural foraminal stenosis. L5-S1: Moderate narrowing of the spinal canal at the L5 level due to retropulsion. There is severe bilateral neural foraminal stenosis, right worse left. IMPRESSION: 1. Status post L5 vertebral augmentation without preoperative comparison. Severe height loss of L5 with mild retropulsion causing moderate spinal canal stenosis and severe bilateral neural foraminal stenosis. Moderate edema throughout, extending to the posterior elements. The findings may indicate a new fracture at the augmented level. Comparison to prior study, preferably the obtained before the vertebral augmentation, might be helpful. 2. L4-L5 severe right and moderate left neural foraminal stenosis with moderate spinal canal stenosis. 3. L3-L4 moderate left neural foraminal stenosis. 4. Chronic height loss of L4 without edema. Electronically Signed   By: Deatra Robinson M.D.   On: 04/19/2018 16:02   Mr [name]  Result Date: 04/10/2018 CLINICAL DATA:  82 y/o  F; L5 kyphoplasty, question infection. EXAM: MRI LUMBAR SPINE WITHOUT CONTRAST TECHNIQUE: Sagittal T2, stir, T1 sequences were acquired. The patient was unable to continue additional sequences were not  acquired. COMPARISON:  None. FINDINGS: Severe motion artifact. L4 moderate compression deformity at L5 low signal from kyphoplasty material noted. Motion artifact limits evaluation including assessment for infection. IMPRESSION: Severe motion artifact. Limited exam. Repeat imaging is recommended when patient is able to remain still. Electronically Signed   By: Mitzi Hansen M.D.   On: 04/10/2018 23:24   Dg Swallowing Func-speech Pathology  Result Date: 04/20/2018 Objective Swallowing Evaluation: Type of Study: MBS-Modified Barium Swallow Study  Patient Details Name: Ceniya Pierce MRN: [id number] Date of Birth: [birth year] Today's Date: 04/20/2018 Time: SLP Start Time (ACUTE ONLY): 0129 -SLP Stop Time (ACUTE ONLY): 0141 SLP Time Calculation (min) (ACUTE ONLY): 12 min Past Medical History: Past Medical History: Diagnosis Date . Benign essential HTN  . Depression  . HLD (hyperlipidemia)  . Neuropathy  . Parkinson's disease dementia [facility])  Past Surgical History: Past Surgical History: Procedure Laterality Date . L5 kyphoplasty Bilateral  HPI: 82 year old female admitted 04/10/18 with AMS, hallucinations, somnolence and difficulty communicating x1 week. PMH: Parkinson's disease, dementia, HTN, HLD, depression, neuropathy, GERD. CXR = RML atelectasis or PNA  Subjective: Pt resting in bed. No family present. Pt slightly more alert today. PT assisted ST to reposition pt to an upright position. Assessment / Plan / Recommendation CHL IP CLINICAL IMPRESSIONS 04/20/2018 Clinical Impression Pt presented with moderate oropharyngeal dysphagia, characterized by anterior loss, decreased control, cohesion and formation of bolus, penetration and silent penetration. Given pt's cogntive impairments secondary to dx dementia, she required max verbal cues to accept nectar, thin, and puree PO's (cues not to phonate during or after swallow). Anterior loss, lingual residue and  decreased formation/cohesion noted across textures. Oral  holding also observed with puree. Instances of silent penetration on anterior laryngeal vestibule with thin liquids were the result of decreased timing and coordination of protective mechanisms. Given airway intrusion with absence of sensation with thin and risk recommend continue nectar thick liquids, puree texture, crushed meds, upright position, no distractions, check oral cavity and full supervision/assist.  SLP Visit Diagnosis Dysphagia, oropharyngeal phase (R13.12) Attention and concentration deficit following -- Frontal lobe and executive function deficit following -- Impact on safety and function Moderate aspiration risk   CHL IP TREATMENT RECOMMENDATION 04/20/2018 Treatment Recommendations Therapy as outlined in treatment plan below   Prognosis 04/20/2018 Prognosis for Safe Diet Advancement Fair Barriers to Reach Goals Cognitive deficits;Severity of deficits Barriers/Prognosis Comment -- CHL IP DIET RECOMMENDATION 04/20/2018 SLP Diet Recommendations Dysphagia 1 (Puree) solids;Nectar thick liquid Liquid Administration via Cup;Straw Medication Administration Crushed with puree Compensations Minimize environmental distractions;Lingual sweep for clearance of pocketing;Small sips/bites;Slow rate;Monitor for anterior loss;Multiple dry swallows after each bite/sip Postural Changes Seated upright at 90 degrees   CHL IP OTHER RECOMMENDATIONS 04/20/2018 Recommended Consults -- Oral Care Recommendations Oral care BID Other Recommendations --   CHL IP FOLLOW UP RECOMMENDATIONS 04/20/2018 Follow up Recommendations Skilled Nursing facility   [facility] IP FREQUENCY AND DURATION 04/20/2018 Speech Therapy Frequency (ACUTE ONLY) min 2x/week Treatment Duration 2 weeks      CHL IP ORAL PHASE 04/20/2018 Oral Phase Impaired Oral - Pudding Teaspoon NT Oral - Pudding Cup NT Oral - Honey Teaspoon -- Oral - Honey Cup -- Oral - Nectar Teaspoon -- Oral - Nectar Cup -- Oral - Nectar Straw Lingual/palatal residue;Right anterior bolus loss;Left  anterior bolus loss Oral - Thin Teaspoon NT Oral - Thin Cup NT Oral - Thin Straw Left anterior bolus loss;Right anterior bolus loss;Lingual/palatal residue;Decreased bolus cohesion Oral - Puree -- Oral - Mech Soft -- Oral - Regular -- Oral - Multi-Consistency -- Oral - Pill -- Oral Phase - Comment --  CHL IP PHARYNGEAL PHASE 04/20/2018 Pharyngeal Phase Impaired Pharyngeal- Pudding Teaspoon NT Pharyngeal -- Pharyngeal- Pudding Cup NT Pharyngeal -- Pharyngeal- Honey Teaspoon -- Pharyngeal -- Pharyngeal- Honey Cup -- Pharyngeal -- Pharyngeal- Nectar Teaspoon -- Pharyngeal -- Pharyngeal- Nectar Cup -- Pharyngeal -- Pharyngeal- Nectar Straw -- Pharyngeal -- Pharyngeal- Thin Teaspoon NT Pharyngeal -- Pharyngeal- Thin Cup NT Pharyngeal -- Pharyngeal- Thin Straw Penetration/Aspiration during swallow Pharyngeal Material enters airway, remains ABOVE vocal cords and not ejected out Pharyngeal- Puree -- Pharyngeal -- Pharyngeal- Mechanical Soft -- Pharyngeal -- Pharyngeal- Regular -- Pharyngeal -- Pharyngeal- Multi-consistency -- Pharyngeal -- Pharyngeal- Pill -- Pharyngeal -- Pharyngeal Comment --  CHL IP CERVICAL ESOPHAGEAL PHASE 04/20/2018 Cervical Esophageal Phase WFL Pudding Teaspoon -- Pudding Cup -- Honey Teaspoon -- Honey Cup -- Nectar Teaspoon -- Nectar Cup -- Nectar Straw -- Thin Teaspoon -- Thin Cup -- Thin Straw -- Puree -- Mechanical Soft -- Regular -- Multi-consistency -- Pill -- Cervical Esophageal Comment -- Royce Macadamia 04/20/2018, 3:23 PM Breck Coons Litaker M.Ed Sports administrator Pager [phone] Office [phone]                Discharge Exam: Vitals:   05/06/18 2225 05/07/18 1258  BP: 132/82 104/81  Pulse: 93 82  Resp: 18 18  Temp: 98.8 F (37.1 C) 99.8 F (37.7 C)  SpO2: 99% 99%   Vitals:   05/06/18 0502 05/06/18 0510 05/06/18 2225 05/07/18 1258  BP:  128/84 132/82 104/81  Pulse:  92 93 82  Resp:  20  18 18  Temp:  97.7 F (36.5 C) 98.8 F (37.1 C) 99.8 F (37.7 C)   TempSrc:      SpO2:  99% 99% 99%  Weight: 58.1 kg     Height:       Gen: NAD   The results of significant diagnostics from this hospitalization (including imaging, microbiology, ancillary and laboratory) are listed below for reference.     Microbiology: Recent Results (from the past 240 hour(s))  Urine culture     Status: None   Collection Time: 05/02/18  7:07 PM  Result Value Ref Range Status   Specimen Description   Final    URINE, CLEAN CATCH Performed at [facility], [address], [place], [place] [address]    Special Requests   Final    NONE Performed at [facility], [address] [place], [place] [address]    Culture   Final    NO GROWTH Performed at [facility], [address] [place], [place] [address]    Report Status 05/04/2018 FINAL  Final  C difficile quick scan w PCR reflex     Status: Abnormal   Collection Time: 05/05/18  2:18 PM  Result Value Ref Range Status   C Diff antigen POSITIVE (A) NEGATIVE Final   C Diff toxin NEGATIVE NEGATIVE Final   C Diff interpretation Results are indeterminate. See PCR results.  Final    Comment: Performed at [facility], [address], [place], [place] [address]  C. Diff by PCR, Reflexed     Status: Abnormal   Collection Time: 05/05/18  2:18 PM  Result Value Ref Range Status   Toxigenic C. Difficile by PCR POSITIVE (A) NEGATIVE Final    Comment: Positive for toxigenic C. difficile with little to no toxin production. Only treat if clinical presentation suggests symptomatic illness. Performed at [facility], [address], [place], [place] [address]      Labs: BNP (last 3 results) No results for input(s): BNP in the last 8760 hours. Basic Metabolic Panel: Recent Labs  Lab 05/02/18 1538  05/03/18 0447  05/03/18 1501 05/03/18 1854  05/04/18 2257 05/05/18 0625 05/05/18 1107 05/06/18 0844 05/07/18 0452  NA 154*   < > 166*   < > 161*  160*   < > 154* 153* 149* 147* 142  K 3.1*   < > 3.8   < > 3.6 4.1  --   --  3.1*  --  3.4* 2.7*  CL 121*   < > 129*   < > 128* 126*  --   --  117*  --  109 110  CO2 25  --  28   < > 27 22  --   --  26  --  27 22  GLUCOSE 100*   < > 119*   < > 117* 132*  --   --  113*  --  132* 109*  BUN 64*   < > 61*   < > 50* 42*  --   --  17  --  6* 5*  CREATININE 1.50*   < > 1.58*   < > 1.29* 1.20*  --   --  0.97  --  0.76 0.67  CALCIUM 7.4*  --  8.5*   < > 8.0* 8.3*  --   --  8.1*  --  8.4* 7.9*  MG 2.3  --  2.4  --   --   --   --   --  2.1  --  2.0 1.8   < > = values in this interval not displayed.   Liver Function Tests: Recent Labs  Lab 05/02/18 1538  AST 24  ALT 9  ALKPHOS 67  BILITOT 0.6  PROT 5.8*  ALBUMIN 2.9*   No results for input(s): LIPASE, AMYLASE in the last 168 hours. No results for input(s): AMMONIA in the last 168 hours. CBC: Recent Labs  Lab 05/02/18 1538 05/02/18 1550 05/03/18 0447 05/05/18 0625  WBC 11.2*  --  10.5 8.1  NEUTROABS  --   --   --  4.3  HGB 13.6 15.3* 13.8 12.4  HCT 44.3 45.0 44.6 38.1  MCV 101.6*  --  100.5* 97.7  PLT 157  --  167 108*   Cardiac Enzymes: No results for input(s): CKTOTAL, CKMB, CKMBINDEX, TROPONINI in the last 168 hours. BNP: Invalid input(s): POCBNP CBG: Recent Labs  Lab 05/02/18 1551  GLUCAP 103*   D-Dimer No results for input(s): DDIMER in the last 72 hours. Hgb A1c No results for input(s): HGBA1C in the last 72 hours. Lipid Profile No results for input(s): CHOL, HDL, LDLCALC, TRIG, CHOLHDL, LDLDIRECT in the last 72 hours. Thyroid function studies No results for input(s): TSH, T4TOTAL, T3FREE, THYROIDAB in the last 72 hours.  Invalid input(s): FREET3 Anemia work up No results for input(s): VITAMINB12, FOLATE, FERRITIN, TIBC, IRON, RETICCTPCT in the last 72 hours. Urinalysis    Component Value Date/Time   COLORURINE AMBER (A) 05/02/2018 1907   APPEARANCEUR HAZY (A) 05/02/2018 1907   LABSPEC 1.021 05/02/2018 1907    PHURINE 5.0 05/02/2018 1907   GLUCOSEU NEGATIVE 05/02/2018 1907   HGBUR SMALL (A) 05/02/2018 1907   BILIRUBINUR NEGATIVE 05/02/2018 1907   KETONESUR 5 (A) 05/02/2018 1907   PROTEINUR NEGATIVE 05/02/2018 1907   NITRITE NEGATIVE 05/02/2018 1907   LEUKOCYTESUR MODERATE (A) 05/02/2018 1907   Sepsis Labs Invalid input(s): PROCALCITONIN,  WBC,  LACTICIDVEN Microbiology Recent Results (from the past 240 hour(s))  Urine culture     Status: None   Collection Time: 05/02/18  7:07 PM  Result Value Ref Range Status   Specimen Description   Final    URINE, CLEAN CATCH Performed at [facility], [address], [place], [place] [address]    Special Requests   Final    NONE Performed at [facility], [address], [place], [place] [address]    Culture   Final    NO GROWTH Performed at [facility], [address] [place], [place] [address]    Report Status 05/04/2018 FINAL  Final  C difficile quick scan w PCR reflex     Status: Abnormal   Collection Time: 05/05/18  2:18 PM  Result Value Ref Range Status   C Diff antigen POSITIVE (A) NEGATIVE Final   C Diff toxin NEGATIVE NEGATIVE Final   C Diff interpretation Results are indeterminate. See PCR results.  Final    Comment: Performed at [facility], [address], [place], [place] [address]  C. Diff by PCR, Reflexed     Status: Abnormal   Collection Time: 05/05/18  2:18 PM  Result Value Ref Range Status   Toxigenic C. Difficile by PCR POSITIVE (A) NEGATIVE Final    Comment: Positive for toxigenic C. difficile with little to no toxin production. Only treat if clinical presentation suggests symptomatic illness. Performed at [facility], [address] [place], [place] [address]  Time coordinating discharge: 20 minutes  SIGNED:  Latrelle Dodrill, MD  [facility] 05/07/2018, 4:30 PM  Pager please text page via  [email or web address]  Note - This record has  been created using [employer]. Chart creation errors have been sought, but may not always have been located. Such creation errors do not reflect on the standard of medical care.

## 2018-05-07 NOTE — Progress Notes (Signed)
PROGRESS NOTE [facility]   WNU:[id number] DOB: 09-Sep-1935  DOA: 05/02/2018 PCP: Angela Cox, MD   Brief Narrative:  Debra Pierce is a 82 year old female with history of severe Parkinson disease/Lewy body dementia, depression and hypertension who was sent from skilled nursing facility after found to be increasingly confused with decreased p.o. intake.  Upon ED evaluation patient appeared to be confused, UA showed features consistent of UTI, elevated sodium at 154, creatinine 1.4 and K 3.1.  Patient was admitted with working diagnosis of acute encephalopathy due to UTI and complicated with hypernatremia.  Patient was treated with antibiotics and IV fluid with improvement of lab findings however remained encephalopathic.  She developed diarrhea and found to be C. difficile positive attempted to treat with vancomycin was done however patient did not tolerate by mouth medication.  Given patient continues declining and unable to get nutritional support palliative care was consulted and decision of comfort care with no aggressive measurement was made by family.  Patient was transitioned to comfort measures and referred to hospice facility.  Subjective: Patient seen and examined she continues to be very lethargic, minimally responsive to verbal command with not able to communicate.  Per nursing staff no further diarrhea, not taking oral medication.  Remains afebrile no acute events overnight.  Assessment & Plan: Acute metabolic encephalopathy  Multifactorial from UTI and dehydration/hypernatremia in setting of severe Parkinson's/Lewy body dementia.  Continues to be encephalopathic, treatment plan have been shifted to comfort measures.  UTI UA grossly abnormal, she completed empiric antibiotic treatment with Rocephin for total of 5 days.  Urine cultures with no growth, however unclear if sample was obtained after antibiotics.  Hypernatremia Due to severe dehydration and poor  oral intake.  Sodium normalized.  No more labs drawn as patient is comfort care.   AKI - resolved From poor oral intake, creatinine returned back to baseline  C. difficile diarrhea C. difficile antigen and toxigenic PCR positive.  Patient was started on oral vancomycin however not willing to take medication, per nursing staff she is not swallowing well.  I have discussed with her son are tentative IV therapy however explained that this may not change course of her declining given that she is very weak and unable to get nutrition.  Given that patient is not having diarrhea currently and treatment for C. difficile will not change course family opted for withdrawal therapy at this time.  I agree  History of dysphagia SLP evaluation appreciated, patient with moderate risk for aspiration.  Continue dysphagia diet for comfort feeds if patient wants.  Family does not want to escalate treatment, no PEG tube.  Parkinson's/Lewy body dementia Continue to hold Sinemet and entacapone.  Do not see any benefit on this medication at this point.    Hypokalemia  Repleted today, no more labs drawn.  Patient on comfort measures.  Goals of care I have discussed with son the patient continues to decline and getting weaker every day.  With no means of nutrition and unable to rehab patient is candidate to hospice care and comfort measures at this point.  Family agrees, will transition focus to comfort care only.  End of life order set has been placed.  DVT prophylaxis: Lovenox Code Status: Full code Family Communication: Family at bedside Disposition Plan: [facility] when bed available  Consultants:   None  Procedures:   None  Antimicrobials:  Rocephin 10/1   Objective: Vitals:   05/05/18 2051 05/06/18 0502 05/06/18 0510 05/06/18  2225  BP: 132/82  128/84 132/82  Pulse: 97  92 93  Resp: 18  20 18   Temp: 98.5 F (36.9 C)  97.7 F (36.5 C) 98.8 F (37.1 C)  TempSrc: Axillary     SpO2: 99%   99% 99%  Weight:  58.1 kg    Height:        Intake/Output Summary (Last 24 hours) at 05/07/2018 1208 Last data filed at 05/06/2018 1815 Gross per 24 hour  Intake 1265.48 ml  Output 450 ml  Net 815.48 ml   Filed Weights   05/04/18 0500 05/05/18 0541 05/06/18 0502  Weight: 59 kg 55.6 kg 58.1 kg    Examination:   General: Lethargic, responds to verbal stimuli Cardiovascular: RRR, S1/S2 Respiratory: Decreased breath sounds, mild rales at the right lower lobe. Abdominal: Soft, NT, ND Extremities: no edema  Data Reviewed: I have personally reviewed following labs and imaging studies  CBC: Recent Labs  Lab 05/02/18 1538 05/02/18 1550 05/03/18 0447 05/05/18 0625  WBC 11.2*  --  10.5 8.1  NEUTROABS  --   --   --  4.3  HGB 13.6 15.3* 13.8 12.4  HCT 44.3 45.0 44.6 38.1  MCV 101.6*  --  100.5* 97.7  PLT 157  --  167 108*   Basic Metabolic Panel: Recent Labs  Lab 05/02/18 1538  05/03/18 0447  05/03/18 1501 05/03/18 1854  05/04/18 2257 05/05/18 0625 05/05/18 1107 05/06/18 0844 05/07/18 0452  NA 154*   < > 166*   < > 161* 160*   < > 154* 153* 149* 147* 142  K 3.1*   < > 3.8   < > 3.6 4.1  --   --  3.1*  --  3.4* 2.7*  CL 121*   < > 129*   < > 128* 126*  --   --  117*  --  109 110  CO2 25  --  28   < > 27 22  --   --  26  --  27 22  GLUCOSE 100*   < > 119*   < > 117* 132*  --   --  113*  --  132* 109*  BUN 64*   < > 61*   < > 50* 42*  --   --  17  --  6* 5*  CREATININE 1.50*   < > 1.58*   < > 1.29* 1.20*  --   --  0.97  --  0.76 0.67  CALCIUM 7.4*  --  8.5*   < > 8.0* 8.3*  --   --  8.1*  --  8.4* 7.9*  MG 2.3  --  2.4  --   --   --   --   --  2.1  --  2.0 1.8   < > = values in this interval not displayed.   GFR: Estimated Creatinine Clearance: 46 mL/min (by C-G formula based on SCr of 0.67 mg/dL). Liver Function Tests: Recent Labs  Lab 05/02/18 1538  AST 24  ALT 9  ALKPHOS 67  BILITOT 0.6  PROT 5.8*  ALBUMIN 2.9*   No results for input(s): LIPASE, AMYLASE  in the last 168 hours. No results for input(s): AMMONIA in the last 168 hours. Coagulation Profile: No results for input(s): INR, PROTIME in the last 168 hours. Cardiac Enzymes: No results for input(s): CKTOTAL, CKMB, CKMBINDEX, TROPONINI in the last 168 hours. BNP (last 3 results) No results for input(s): PROBNP in the last  8760 hours. HbA1C: No results for input(s): HGBA1C in the last 72 hours. CBG: Recent Labs  Lab 05/02/18 1551  GLUCAP 103*   Lipid Profile: No results for input(s): CHOL, HDL, LDLCALC, TRIG, CHOLHDL, LDLDIRECT in the last 72 hours. Thyroid Function Tests: No results for input(s): TSH, T4TOTAL, FREET4, T3FREE, THYROIDAB in the last 72 hours. Anemia Panel: No results for input(s): VITAMINB12, FOLATE, FERRITIN, TIBC, IRON, RETICCTPCT in the last 72 hours. Sepsis Labs: Recent Labs  Lab 05/02/18 1550  LATICACIDVEN 1.72    Recent Results (from the past 240 hour(s))  Urine culture     Status: None   Collection Time: 05/02/18  7:07 PM  Result Value Ref Range Status   Specimen Description   Final    URINE, CLEAN CATCH Performed at [facility], [address], [place], [place] [address]    Special Requests   Final    NONE Performed at [facility], [address] [place], [place] [address]    Culture   Final    NO GROWTH Performed at [facility], [address], [place], [place] [address]    Report Status 05/04/2018 FINAL  Final  C difficile quick scan w PCR reflex     Status: Abnormal   Collection Time: 05/05/18  2:18 PM  Result Value Ref Range Status   C Diff antigen POSITIVE (A) NEGATIVE Final   C Diff toxin NEGATIVE NEGATIVE Final   C Diff interpretation Results are indeterminate. See PCR results.  Final    Comment: Performed at [facility], [address] [place], [place] [address]  C. Diff by PCR, Reflexed     Status: Abnormal   Collection Time: 05/05/18  2:18 PM  Result Value  Ref Range Status   Toxigenic C. Difficile by PCR POSITIVE (A) NEGATIVE Final    Comment: Positive for toxigenic C. difficile with little to no toxin production. Only treat if clinical presentation suggests symptomatic illness. Performed at [facility], [address], [place], [place] [address]       Radiology Studies: No results found.    Scheduled Meds: . feeding supplement (ENSURE ENLIVE)  237 mL Oral TID BM  . multivitamin with minerals  1 tablet Oral Daily   Continuous Infusions:    LOS: 4 days    Time spent: Total of 35 minutes spent with pt, greater than 50% of which was spent in discussion of  treatment, counseling and coordination of care  Latrelle Dodrill, MD Pager: Text Page via [email or web address]   If 7PM-7AM, please contact night-coverage [email or web address] 05/07/2018, 12:08 PM   Note - This record has been created using [employer]. Chart creation errors have been sought, but may not always have been located. Such creation errors do not reflect on the standard of medical care.

## 2018-05-07 NOTE — Progress Notes (Signed)
Lab Research officer, political party with critical lab value Potassium  2.7  Writer notified provider. Will continue to monitor.

## 2018-05-11 ENCOUNTER — Ambulatory Visit: Payer: Medicare Other | Admitting: Neurology

## 2018-06-02 DEATH — deceased

## 2018-12-20 IMAGING — CR DG CHEST 2V
2 series · 2 of 2 positions shown · non-contrast
Comparison: 04/10/2018

CLINICAL DATA: Increased altered mental status over the last 2
days.

EXAM:
CHEST - 2 VIEW

[w chest lat]
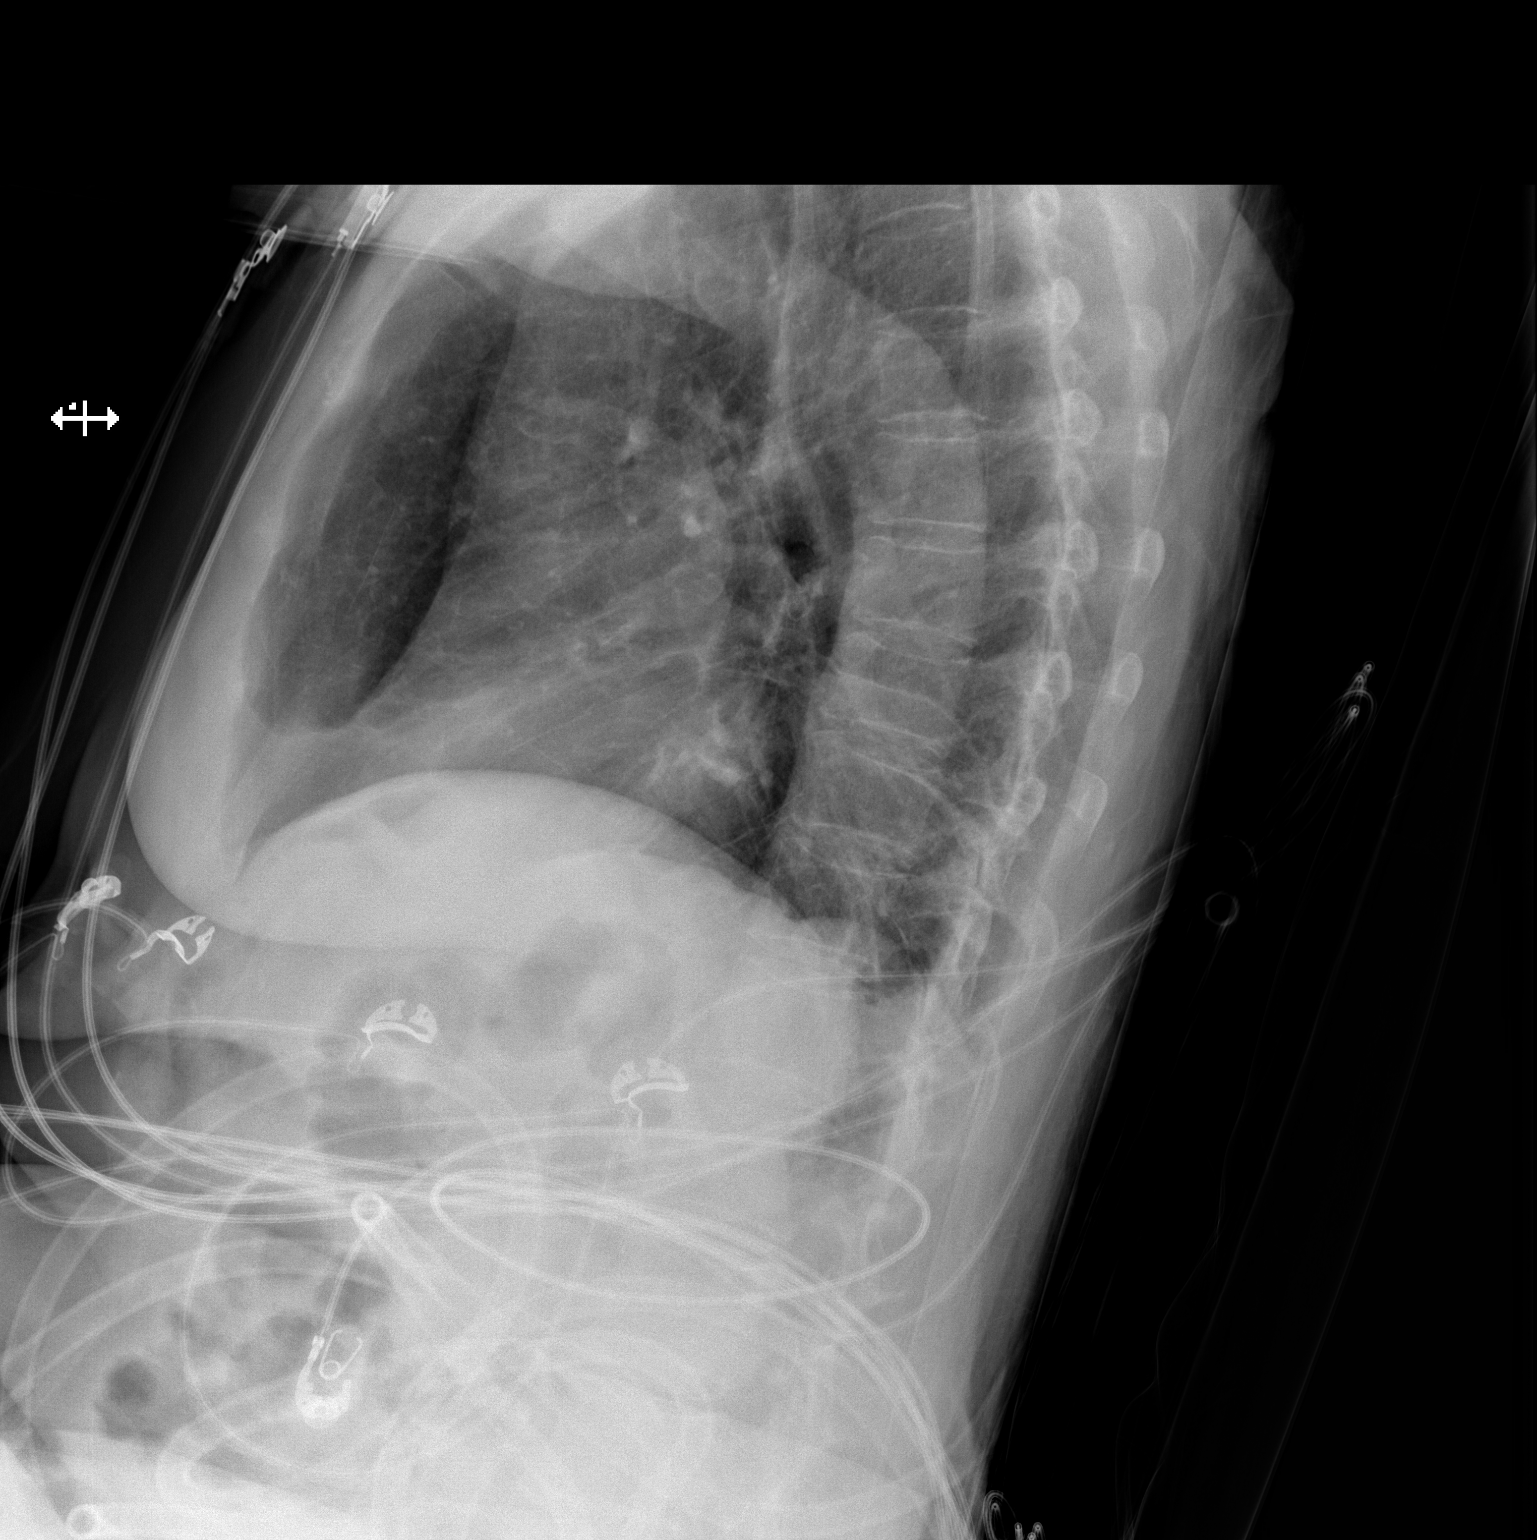

[x chest ap]
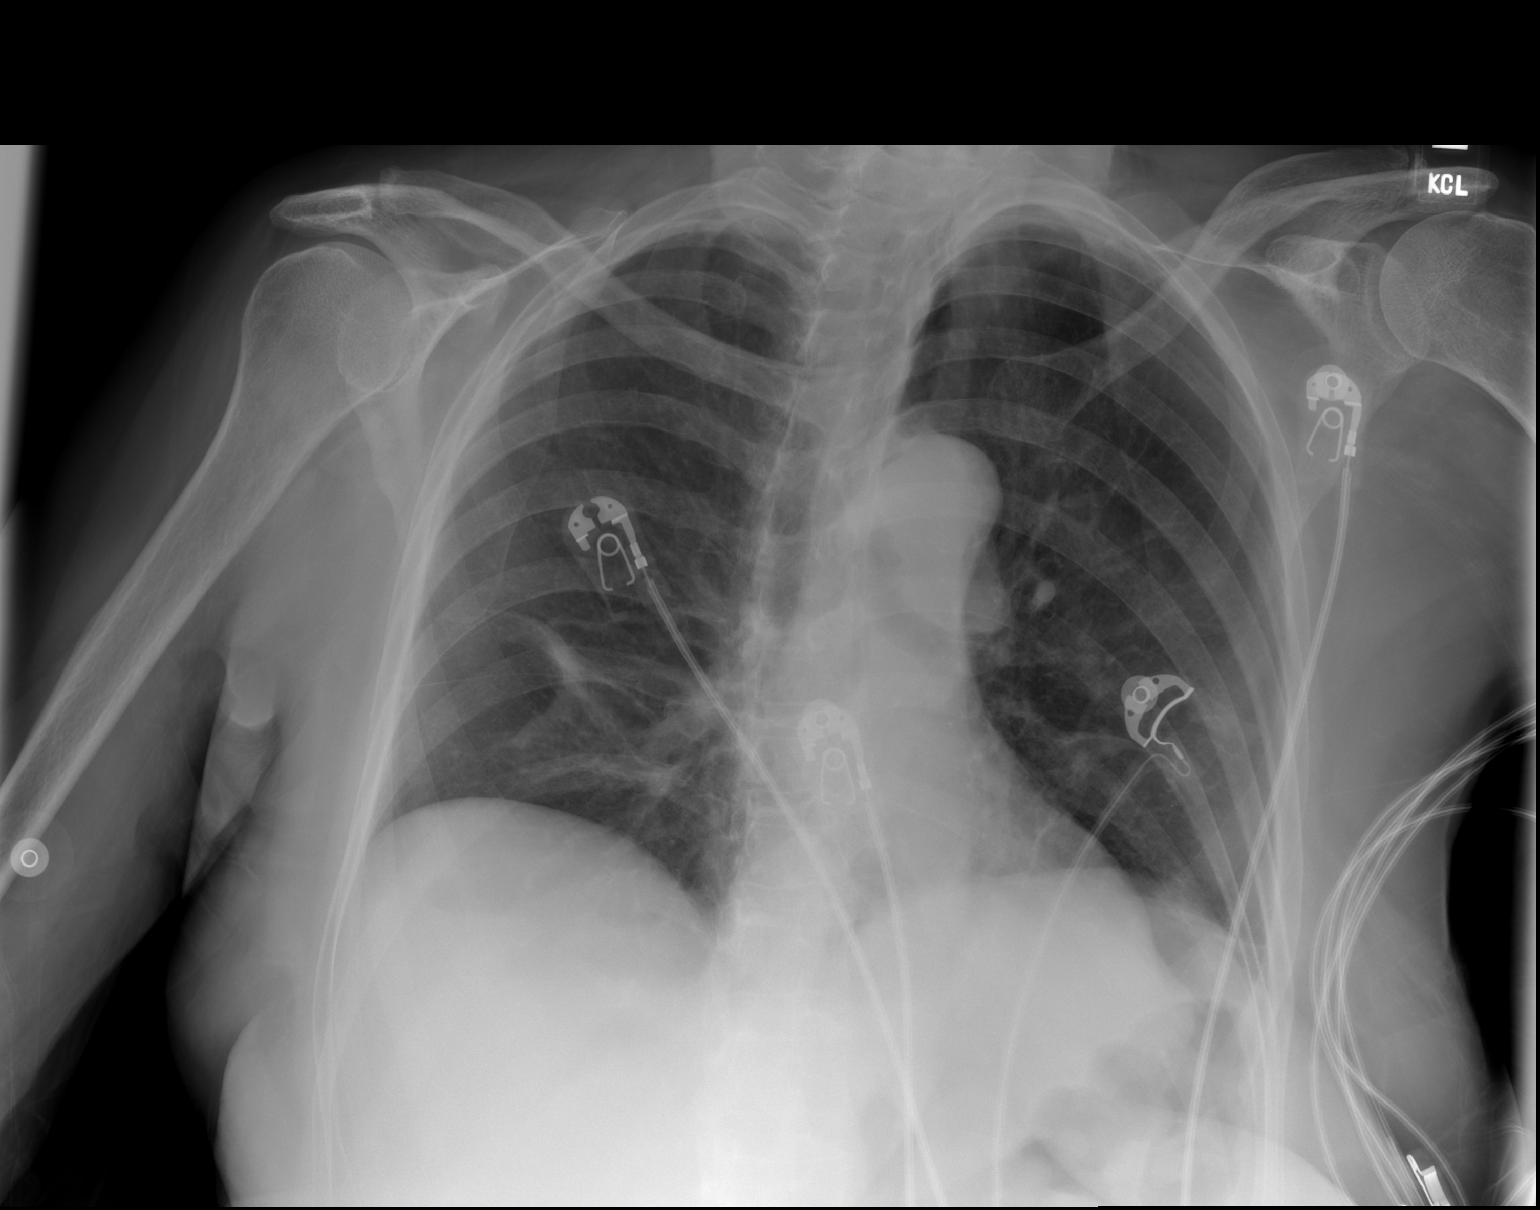

[2 of 2 positions shown; findings below may reference images not displayed]

FINDINGS: Opacity at the right lung base is stable from the prior exam,
consistent with atelectasis. Lungs otherwise clear.

Cardiac silhouette is normal in size. No mediastinal or hilar
masses. No evidence of adenopathy

No pleural effusion or pneumothorax.

Skeletal structures are intact.
IMPRESSION: No active cardiopulmonary disease.
